# Patient Record
Sex: Male | Born: 1950 | Race: White | Hispanic: No | Marital: Married | Smoking: Former smoker
Health system: Southern US, Community
[De-identification: ages and names within clinical notes are randomized; demographics above are authoritative.]

## PROBLEM LIST (undated history)

## (undated) ENCOUNTER — Emergency Department (HOSPITAL_BASED_OUTPATIENT_CLINIC_OR_DEPARTMENT_OTHER): Admission: EM

## (undated) DIAGNOSIS — R972 Elevated prostate specific antigen [PSA]: Secondary | ICD-10-CM

## (undated) DIAGNOSIS — E291 Testicular hypofunction: Secondary | ICD-10-CM

## (undated) DIAGNOSIS — L409 Psoriasis, unspecified: Secondary | ICD-10-CM

## (undated) DIAGNOSIS — I452 Bifascicular block: Secondary | ICD-10-CM

## (undated) DIAGNOSIS — I77819 Aortic ectasia, unspecified site: Secondary | ICD-10-CM

## (undated) DIAGNOSIS — J189 Pneumonia, unspecified organism: Secondary | ICD-10-CM

## (undated) DIAGNOSIS — I10 Essential (primary) hypertension: Secondary | ICD-10-CM

## (undated) DIAGNOSIS — M1990 Unspecified osteoarthritis, unspecified site: Secondary | ICD-10-CM

## (undated) DIAGNOSIS — R739 Hyperglycemia, unspecified: Secondary | ICD-10-CM

## (undated) DIAGNOSIS — C61 Malignant neoplasm of prostate: Secondary | ICD-10-CM

## (undated) HISTORY — PX: TONSILLECTOMY: SUR1361

## (undated) HISTORY — DX: Elevated prostate specific antigen (PSA): R97.20

## (undated) HISTORY — DX: Aortic ectasia, unspecified site: I77.819

## (undated) HISTORY — DX: Hyperglycemia, unspecified: R73.9

## (undated) HISTORY — DX: Malignant neoplasm of prostate: C61

## (undated) HISTORY — DX: Testicular hypofunction: E29.1

## (undated) HISTORY — DX: Bifascicular block: I45.2

## (undated) HISTORY — DX: Psoriasis, unspecified: L40.9

---

## 2005-09-04 HISTORY — PX: HERNIA REPAIR: SHX51

## 2009-10-05 HISTORY — PX: CATARACT EXTRACTION: SUR2

## 2016-09-04 DIAGNOSIS — J40 Bronchitis, not specified as acute or chronic: Secondary | ICD-10-CM

## 2016-09-04 HISTORY — DX: Bronchitis, not specified as acute or chronic: J40

## 2017-06-04 HISTORY — PX: BASAL CELL CARCINOMA EXCISION: SHX1214

## 2018-07-04 DIAGNOSIS — C801 Malignant (primary) neoplasm, unspecified: Secondary | ICD-10-CM

## 2018-07-04 DIAGNOSIS — C4491 Basal cell carcinoma of skin, unspecified: Secondary | ICD-10-CM

## 2018-07-04 HISTORY — DX: Basal cell carcinoma of skin, unspecified: C44.91

## 2018-07-04 HISTORY — DX: Malignant (primary) neoplasm, unspecified: C80.1

## 2018-07-05 HISTORY — PX: BICEPS TENDON REPAIR: SHX566

## 2019-02-10 ENCOUNTER — Telehealth: Payer: Self-pay

## 2019-02-10 NOTE — Telephone Encounter (Signed)
    [facility] Pre-operative Risk Assessment    Request for surgical clearance:  1. What type of surgery is being performed? LEFT TOTAL KNEE REPLACEMENT   2. When is this surgery scheduled? TBD   3. What type of clearance is required (medical clearance vs. Pharmacy clearance to hold med vs. Both)? MEDICAL  4. Are there any medications that need to be held prior to surgery and how long? NONE LISTED   5. Practice name and name of physician performing surgery? [facility] ATTN:KELLY HIGH  6. What is your office phone number [phone]    7.   What is your office fax number [phone]  8.   Anesthesia type (None, local, MAC, general) ? NOT LISTED  PT HAS APPT SCHEDULED 02-14-2019 _0      Richard Harmon 02/10/2019, 1:01 PM  _________________________________________________________________   (provider comments below)

## 2019-02-10 NOTE — Telephone Encounter (Signed)
New pt to cardiology. He has a new pt appt with Dr. Gwenlyn Found scheduled on 02/14/19 for cardiovascular /preopeative risk assessment. Dr. Gwenlyn Found to determine clearance.

## 2019-02-11 DIAGNOSIS — I10 Essential (primary) hypertension: Secondary | ICD-10-CM

## 2019-02-11 DIAGNOSIS — E291 Testicular hypofunction: Secondary | ICD-10-CM

## 2019-02-11 DIAGNOSIS — E785 Hyperlipidemia, unspecified: Secondary | ICD-10-CM | POA: Diagnosis present

## 2019-02-11 DIAGNOSIS — M1712 Unilateral primary osteoarthritis, left knee: Secondary | ICD-10-CM | POA: Diagnosis present

## 2019-02-11 HISTORY — DX: Unilateral primary osteoarthritis, left knee: M17.12

## 2019-02-11 HISTORY — DX: Testicular hypofunction: E29.1

## 2019-02-11 HISTORY — DX: Hyperlipidemia, unspecified: E78.5

## 2019-02-11 HISTORY — DX: Essential (primary) hypertension: I10

## 2019-02-11 NOTE — H&P (Signed)
KNEE ARTHROPLASTY ADMISSION H&P  Patient ID: Richard Harmon MRN: [id number] DOB/AGE: 05/11/1951 68 y.o.  Chief Complaint: left knee pain.  Planned Procedure Date: 03/04/2019 Medical Clearance by Dr. London Pepper   HPI: Richard Harmon is a 68 y.o. male with a history of HTN, HLD, testosterone deficiency, and RBBB (cardiac clearance pending) who presents for evaluation of OA LEFT KNEE. The patient has a history of pain and functional disability in the left knee due to arthritis and has failed non-surgical conservative treatments for greater than 12 weeks to include NSAID's and/or analgesics, corticosteriod injections, viscosupplementation injections and activity modification.  Onset of symptoms was gradual, starting 6 years ago with gradually worsening course since that time. Patient currently rates pain at 9 out of 10 with activity. Patient has night pain, worsening of pain with activity and weight bearing and pain that interferes with activities of daily living.  Patient has evidence of periarticular osteophytes and joint space narrowing by imaging studies.  There is no active infection.  Past medical history: Hypertension, hyperlipidemia, RBBB, and testosterone deficiency.  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  Rare alcohol use.  Family history: Mother with HTN, CVA.  Father with heart disease, MI.  Maternal grandfather with cancer.  Child with seizures.   ROS: Currently denies lightheadedness, dizziness, Fever, chills, CP, SOB.   No personal history of DVT, PE, MI, or CVA. No loose teeth or  dentures. He wears hearing aids and a right contact.  All other systems have been reviewed and were otherwise currently negative with the exception of those mentioned in the HPI and as above.  Objective: Vitals: Ht: 5'6" Wt: 195 Temp: 98.8 BP: 161/89 Pulse: 76 O2 95% on room air.   Physical Exam: General: Alert, NAD.  Antalgic Gait  HEENT: EOMI, Good Neck Extension  Pulm: No increased work of breathing.  Clear B/L A/P w/o crackle or wheeze.  CV: RRR, No m/g/r appreciated  GI: soft, NT, ND Neuro: Neuro without gross focal deficit.  Sensation intact distally Skin: No lesions in the area of chief complaint MSK/Surgical Site: left knee w/o redness or effusion.  + JLT and patellar crepitus. ROM 2-115.  5/5 strength in extension and flexion.  +EHL/FHL.  NVI.  Stable varus and valgus stress.    Imaging Review Plain radiographs demonstrate severe degenerative joint disease of the left knee.   Preoperative templating of the joint replacement has been completed, documented, and submitted to the Operating Room personnel in order to optimize intra-operative equipment management.  Assessment: OA LEFT KNEE Principal Problem:   Primary osteoarthritis of left knee Active Problems:   Essential hypertension   Hyperlipidemia   Testosterone deficiency in male   Plan: Plan for Procedure(s): TOTAL KNEE ARTHROPLASTY  The patient history, physical exam, clinical judgement of the provider and imaging are consistent with end stage degenerative joint disease and total joint arthroplasty is deemed medically necessary. The treatment options including medical management, injection therapy, and arthroplasty were discussed at length. The risks and benefits of Procedure(s): TOTAL KNEE ARTHROPLASTY were presented and reviewed.  The risks of nonoperative treatment, versus surgical intervention including but not limited to  continued pain, aseptic loosening, stiffness, dislocation/subluxation, infection, bleeding,  nerve injury, blood clots, cardiopulmonary complications, morbidity, mortality, among others were discussed. The patient verbalizes understanding and wishes to proceed with the plan.  Patient is being admitted for inpatient treatment for surgery, pain control, PT, OT, prophylactic antibiotics, VTE prophylaxis, progressive ambulation, ADL's and discharge planning.   Dental prophylaxis discussed and recommended for 2 years postoperatively.   The patient does meet the criteria for TXA which will be used perioperatively.    ASA 81 mg BID will be used postoperatively for DVT prophylaxis in addition to SCDs, and early ambulation.  Plan for Tylenol, Celebrex, oxycodone for pain.  NO Gabapentin - wife had bad rxn to it.  Baclofen for spasm.  Omeprazole for gastric protection.  The patient is planning to be discharged home with home health services ([facility]) in care of his wife.   Patient's anticipated LOS is less than 2 midnights, meeting these requirements: - Lives within 1 hour of care - Has a competent adult at home to recover with post-op recover - NO history of  - Chronic pain requiring opiods  - Diabetes  - Coronary Artery Disease  - Heart failure  - Heart attack  - Stroke  - DVT/VTE  - Cardiac arrhythmia  - Respiratory Failure/COPD  - Renal failure  - Anemia  - Advanced Liver disease   [name], PA-C 02/11/2019 3:47 PM

## 2019-02-14 ENCOUNTER — Encounter: Payer: Self-pay | Admitting: Cardiovascular Disease

## 2019-02-14 ENCOUNTER — Other Ambulatory Visit: Payer: Self-pay

## 2019-02-14 ENCOUNTER — Ambulatory Visit (INDEPENDENT_AMBULATORY_CARE_PROVIDER_SITE_OTHER): Payer: Medicare Other | Admitting: Cardiovascular Disease

## 2019-02-14 DIAGNOSIS — Z01818 Encounter for other preprocedural examination: Secondary | ICD-10-CM | POA: Diagnosis not present

## 2019-02-14 NOTE — Assessment & Plan Note (Signed)
Richard Harmon was referred by Dr. Orland Mustard for preoperative clearance before left total knee replacement scheduled to be performed by Dr. Edmonia Lynch 6/30.  He has no cardiac risk factors.  He is completely asymptomatic.  There is no family history.  He otherwise was fairly active.  I am clearing him at low risk without the need for functional study.Marland Kitchen

## 2019-02-14 NOTE — Progress Notes (Signed)
02/14/2019 Nyshawn Gowdy   1950/09/05  [id number]  Primary Physician London Pepper, MD Primary Cardiologist: Lorretta Harp MD Lupe Carney, [place]  HPI:  Dmarcus Decicco is a 68 y.o. 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  He was referred by Dr. Orland Mustard for preoperative clearance before elective left total knee replacement scheduled to be performed by Dr. Fredonia Highland 6/30.  He has no cardiac risk factors.  Is never had a heart attack or stroke.  There is no family history.  He denies chest pain or shortness of breath.  He is pretty active walks when he is able to because of his knee.   No outpatient medications have been marked as taking for the 02/14/19 encounter (Office Visit) with Lorretta Harp, MD.     Not on File  Social History   Socioeconomic History  . Marital status: Married    Spouse name: Not on file  . Number of children: Not on file  . Years of education: Not on file  . Highest education level: Not on file  Occupational History  . Not on file  Social Needs  . Financial resource strain: Not on file  . Food insecurity    Worry: Not on file    Inability: Not on file  . Transportation needs    Medical: Not on file    Non-medical: Not on file  Tobacco Use  . Smoking status: Not on file  Substance and Sexual Activity  . Alcohol use: Not on file  . Drug use: Not on file  . Sexual activity: Not on file  Lifestyle  . Physical activity    Days per week: Not on file    Minutes per session: Not on file  . Stress: Not on file  Relationships  . Social Herbalist on phone: Not on file    Gets together: Not on file    Attends religious service: Not on file    Active member of club or organization: Not on file    Attends meetings of clubs or organizations: Not on file    Relationship status: Not on file  . Intimate partner  violence    Fear of current or ex partner: Not on file    Emotionally abused: Not on file    Physically abused: Not on file    Forced sexual activity: Not on file  Other Topics Concern  . Not on file  Social History Narrative  . Not on file     Review of Systems: General: negative for chills, fever, night sweats or weight changes.  Cardiovascular: negative for chest pain, dyspnea on exertion, edema, orthopnea, palpitations, paroxysmal nocturnal dyspnea or shortness of breath Dermatological: negative for rash Respiratory: negative for cough or wheezing Urologic: negative for hematuria Abdominal: negative for nausea, vomiting, diarrhea, bright red blood per rectum, melena, or hematemesis Neurologic: negative for visual changes, syncope, or dizziness All other systems reviewed and are otherwise negative except as noted above.    Blood pressure 138/76, pulse 84, temperature 99.1 F (37.3 C), height 5\' 9"  (1.753 m), weight 190 lb (86.2 kg).  General appearance: alert and no distress Neck: no adenopathy, no carotid bruit, no JVD, supple, symmetrical, trachea midline and thyroid not enlarged, symmetric, no tenderness/mass/nodules Lungs: clear to auscultation bilaterally Heart: regular rate and rhythm, S1, S2 normal, no murmur,  click, rub or gallop Extremities: extremities normal, atraumatic, no cyanosis or edema Pulses: 2+ and symmetric Skin: Skin color, texture, turgor normal. No rashes or lesions Neurologic: Alert and oriented X 3, normal strength and tone. Normal symmetric reflexes. 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  I personally reviewed this EKG.  ASSESSMENT AND PLAN:   Preoperative clearance Mr. Bruso was referred by Dr. Orland Mustard for preoperative clearance before left total knee replacement scheduled to be performed by Dr. Edmonia Lynch 6/30.  He has no cardiac risk  factors.  He is completely asymptomatic.  There is no family history.  He otherwise was fairly active.  I am clearing him at low risk without the need for functional study.Lorretta Harp MD FACP,FACC,FAHA, [facility] 02/14/2019 4:33 PM

## 2019-02-14 NOTE — Patient Instructions (Signed)
Medication Instructions:  Your physician recommends that you continue on your current medications as directed. Please refer to the Current Medication list given to you today.  If you need a refill on your cardiac medications before your next appointment, please call your pharmacy.   Lab work: NONE If you have labs (blood work) drawn today and your tests are completely normal, you will receive your results only by: Marland Kitchen MyChart Message (if you have MyChart) OR . A paper copy in the mail If you have any lab test that is abnormal or we need to change your treatment, we will call you to review the results.  Testing/Procedures: NONE  Follow-Up: At [facility], you and your health needs are our priority.  As part of our continuing mission to provide you with exceptional heart care, we have created designated Provider Care Teams.  These Care Teams include your primary Cardiologist (physician) and Advanced Practice Providers (APPs -  Physician Assistants and Nurse Practitioners) who all work together to provide you with the care you need, when you need it. . You will need a follow up appointment AS NEEDED. You may see Dr. Gwenlyn Found or one of the following Advanced Practice Providers on your designated Care Team:   . Kerin Ransom, [place] . Almyra Deforest, PA-C . Fabian Sharp, PA-C . Jory Sims, DNP . Rosaria Ferries, PA-C . Roby Lofts, PA-C . Sande Rives, PA-C  Any Other Special Instructions Will Be Listed Below (If Applicable). YOU HAVE BEEN CLEARED AT LOW RISK FROM A CARDIAC STANDPOINT FOR YOUR UPCOMING PROCEDURE.

## 2019-02-21 NOTE — Patient Instructions (Addendum)
Richard Harmon    Your procedure is scheduled on: 03-04-2019  Report to [facility] Main  Entrance  Report to SHORT STAY at 530 AM   YOU NEED TO HAVE A COVID 19 TEST ON 02-28-19  @ 2:00 PM_______, THIS TEST MUST BE DONE BEFORE SURGERY, COME TO [place]. ONCE YOUR COVID TEST IS COMPLETED, PLEASE BEGIN THE QUARANTINE INSTRUCTIONS AS OUTLINED IN YOUR HANDOUT.   Call this number if you have problems the morning of surgery [phone]    Remember:  NO SOLID FOOD AFTER MIDNIGHT THE NIGHT PRIOR TO SURGERY. NOTHING BY MOUTH EXCEPT CLEAR LIQUIDS UNTIL 430 AM. PLEASE FINISH ENSURE DRINK PER SURGEON ORDER 3 HOURS PRIOR TO SCHEDULED SURGERY TIME WHICH NEEDS TO BE COMPLETED AT 430 AM..    CLEAR LIQUID DIET   Foods Allowed                                                                     Foods Excluded  Coffee and tea, regular and decaf                             liquids that you cannot  Plain Jell-O in any flavor                                             see through such as: Fruit ices (not with fruit pulp)                                     milk, soups, orange juice  Iced Popsicles                                    All solid food Carbonated beverages, regular and diet                                    Cranberry, grape and apple juices Sports drinks like Gatorade Lightly seasoned clear broth or consume(fat free) Sugar, honey syrup  Sample Menu Breakfast                                Lunch                                     Supper Cranberry juice                    Beef broth                            Chicken broth Jell-O  Grape juice                           Apple juice Coffee or tea                        Jell-O                                      Popsicle                                                Coffee or tea                        Coffee or  tea  _____________________________________________________________________    Take these medicines the morning of surgery with A SIP OF WATER:NONE  BRUSH YOUR TEETH MORNING OF SURGERY AND RINSE YOUR MOUTH OUT, NO CHEWING GUM CANDY OR MINTS.                                You may not have any metal on your body including hair pins and              piercings     Do not wear jewelry, cologne, lotions, powders or perfumes, deodorant                         Men may shave face and neck.   Do not bring valuables to the hospital. [place].  Contacts, dentures or bridgework may not be worn into surgery.       _____________________________________________________________________             [facility] - Preparing for Surgery Before surgery, you can play an important role.  Because skin is not sterile, your skin needs to be as free of germs as possible.  You can reduce the number of germs on your skin by washing with CHG (chlorahexidine gluconate) soap before surgery.  CHG is an antiseptic cleaner which kills germs and bonds with the skin to continue killing germs even after washing. Please DO NOT use if you have an allergy to CHG or antibacterial soaps.  If your skin becomes reddened/irritated stop using the CHG and inform your nurse when you arrive at Short Stay. Do not shave (including legs and underarms) for at least 48 hours prior to the first CHG shower.  You may shave your face/neck. Please follow these instructions carefully:  1.  Shower with CHG Soap the night before surgery and the  morning of Surgery.  2.  If you choose to wash your hair, wash your hair first as usual with your  normal  shampoo.  3.  After you shampoo, rinse your hair and body thoroughly to remove the  shampoo.                           4.  Use CHG as you would any other liquid soap.  You can  apply chg directly  to the skin and wash                       Gently  with a scrungie or clean washcloth.  5.  Apply the CHG Soap to your body ONLY FROM THE NECK DOWN.   Do not use on face/ open                           Wound or open sores. Avoid contact with eyes, ears mouth and genitals (private parts).                       Wash face,  Genitals (private parts) with your normal soap.             6.  Wash thoroughly, paying special attention to the area where your surgery  will be performed.  7.  Thoroughly rinse your body with warm water from the neck down.  8.  DO NOT shower/wash with your normal soap after using and rinsing off  the CHG Soap.                9.  Pat yourself dry with a clean towel.            10.  Wear clean pajamas.            11.  Place clean sheets on your bed the night of your first shower and do not  sleep with pets. Day of Surgery : Do not apply any lotions/deodorants the morning of surgery.  Please wear clean clothes to the hospital/surgery center.  FAILURE TO FOLLOW THESE INSTRUCTIONS MAY RESULT IN THE CANCELLATION OF YOUR SURGERY PATIENT SIGNATURE_________________________________  NURSE SIGNATURE__________________________________  ________________________________________________________________________   Richard Harmon  An incentive spirometer is a tool that can help keep your lungs clear and active. This tool measures how well you are filling your lungs with each breath. Taking long deep breaths may help reverse or decrease the chance of developing breathing (pulmonary) problems (especially infection) following:  A long period of time when you are unable to move or be active. BEFORE THE PROCEDURE   If the spirometer includes an indicator to show your best effort, your nurse or respiratory therapist will set it to a desired goal.  If possible, sit up straight or lean slightly forward. Try not to slouch.  Hold the incentive spirometer in an upright position. INSTRUCTIONS FOR USE  1. Sit on the edge of your bed if  possible, or sit up as far as you can in bed or on a chair. 2. Hold the incentive spirometer in an upright position. 3. Breathe out normally. 4. Place the mouthpiece in your mouth and seal your lips tightly around it. 5. Breathe in slowly and as deeply as possible, raising the piston or the ball toward the top of the column. 6. Hold your breath for 3-5 seconds or for as long as possible. Allow the piston or ball to fall to the bottom of the column. 7. Remove the mouthpiece from your mouth and breathe out normally. 8. Rest for a few seconds and repeat Steps 1 through 7 at least 10 times every 1-2 hours when you are awake. Take your time and take a few normal breaths between deep breaths. 9. The spirometer may include an indicator to show your best effort. Use the indicator as a goal to work toward during  each repetition. 10. After each set of 10 deep breaths, practice coughing to be sure your lungs are clear. If you have an incision (the cut made at the time of surgery), support your incision when coughing by placing a pillow or rolled up towels firmly against it. Once you are able to get out of bed, walk around indoors and cough well. You may stop using the incentive spirometer when instructed by your caregiver.  RISKS AND COMPLICATIONS  Take your time so you do not get dizzy or light-headed.  If you are in pain, you may need to take or ask for pain medication before doing incentive spirometry. It is harder to take a deep breath if you are having pain. AFTER USE  Rest and breathe slowly and easily.  It can be helpful to keep track of a log of your progress. Your caregiver can provide you with a simple table to help with this. If you are using the spirometer at home, follow these instructions: [place] IF:   You are having difficultly using the spirometer.  You have trouble using the spirometer as often as instructed.  Your pain medication is not giving enough relief while using  the spirometer.  You develop fever of 100.5 F (38.1 C) or higher. SEEK IMMEDIATE MEDICAL CARE IF:   You cough up bloody sputum that had not been present before.  You develop fever of 102 F (38.9 C) or greater.  You develop worsening pain at or near the incision site. MAKE SURE YOU:   Understand these instructions.  Will watch your condition.  Will get help right away if you are not doing well or get worse. Document Released: 01/01/2007 Document Revised: 11/13/2011 Document Reviewed: 03/04/2007 [facility] Patient Information 2014 [employer], [place].   ________________________________________________________________________

## 2019-02-24 ENCOUNTER — Other Ambulatory Visit: Payer: Self-pay

## 2019-02-24 ENCOUNTER — Encounter (HOSPITAL_COMMUNITY)
Admission: RE | Admit: 2019-02-24 | Discharge: 2019-02-24 | Disposition: A | Discharge status: Home / Self Care | Payer: Medicare Other | Source: Ambulatory Visit | Attending: Orthopedic Surgery | Admitting: Orthopedic Surgery

## 2019-02-24 ENCOUNTER — Encounter (HOSPITAL_COMMUNITY): Payer: Self-pay

## 2019-02-24 DIAGNOSIS — Z79899 Other long term (current) drug therapy: Secondary | ICD-10-CM | POA: Diagnosis not present

## 2019-02-24 DIAGNOSIS — Z87891 Personal history of nicotine dependence: Secondary | ICD-10-CM | POA: Diagnosis not present

## 2019-02-24 DIAGNOSIS — Z01818 Encounter for other preprocedural examination: Secondary | ICD-10-CM | POA: Insufficient documentation

## 2019-02-24 DIAGNOSIS — M1712 Unilateral primary osteoarthritis, left knee: Secondary | ICD-10-CM | POA: Diagnosis not present

## 2019-02-24 DIAGNOSIS — I10 Essential (primary) hypertension: Secondary | ICD-10-CM | POA: Diagnosis not present

## 2019-02-24 DIAGNOSIS — Z7982 Long term (current) use of aspirin: Secondary | ICD-10-CM | POA: Diagnosis not present

## 2019-02-24 HISTORY — DX: Essential (primary) hypertension: I10

## 2019-02-24 HISTORY — DX: Unspecified osteoarthritis, unspecified site: M19.90

## 2019-02-24 HISTORY — DX: Pneumonia, unspecified organism: J18.9

## 2019-02-24 LAB — CBC
HCT: 53 % — ABNORMAL HIGH (ref 39.0–52.0)
Hemoglobin: 16.3 g/dL (ref 13.0–17.0)
MCH: 24.7 pg — ABNORMAL LOW (ref 26.0–34.0)
MCHC: 30.8 g/dL (ref 30.0–36.0)
MCV: 80.2 fL (ref 80.0–100.0)
Platelets: 219 10*3/uL (ref 150–400)
RBC: 6.61 MIL/uL — ABNORMAL HIGH (ref 4.22–5.81)
RDW: 18.1 % — ABNORMAL HIGH (ref 11.5–15.5)
WBC: 7.3 10*3/uL (ref 4.0–10.5)
nRBC: 0 % (ref 0.0–0.2)

## 2019-02-24 LAB — BASIC METABOLIC PANEL
Anion gap: 10 (ref 5–15)
BUN: 22 mg/dL (ref 8–23)
CO2: 28 mmol/L (ref 22–32)
Calcium: 9.4 mg/dL (ref 8.9–10.3)
Chloride: 102 mmol/L (ref 98–111)
Creatinine, Ser: 0.87 mg/dL (ref 0.61–1.24)
GFR calc Af Amer: >60 mL/min (ref >60)
GFR calc non Af Amer: >60 mL/min (ref >60)
Glucose, Bld: 107 mg/dL — ABNORMAL HIGH (ref 70–99)
Potassium: 4.2 mmol/L (ref 3.5–5.1)
Sodium: 140 mmol/L (ref 135–145)

## 2019-02-24 LAB — SURGICAL PCR SCREEN
MRSA, PCR: NEGATIVE
Staphylococcus aureus: NEGATIVE

## 2019-02-24 NOTE — Progress Notes (Signed)
02-14-19 ( Epic) Cardiac clearance from D. Berry and EKG

## 2019-02-26 NOTE — Progress Notes (Signed)
Anesthesia Chart Review   Case: [id number] Date/Time: 03/04/19 0715   Procedure: TOTAL KNEE ARTHROPLASTY (Left )   Anesthesia type: Choice   Pre-op diagnosis: OA LEFT KNEE   Location: [facility] ROOM 08 / [facility]   Surgeon: Renette Butters, MD      DISCUSSION:68 y.o. former smoker (1 pack years) with h/o HTN, left knee OA scheduled for above procedure 03/04/19 with Dr. Edmonia Lynch.   Pt seen by cardiologist, Dr. Quay Burow, for preoperative evaluation on 02/14/2019.  Per OV note, " He has no cardiac risk factors.  He is completely asymptomatic.  There is no family history.  He otherwise was fairly active.  I am clearing him at low risk without the need for functional study."  Anticipate pt can proceed with planned procedure barring acute status change.   VS: BP (!) 162/86   Pulse 75   Temp 37.2 C (Oral)   Resp 18   Ht 5\' 9"  (1.753 m)   Wt 87.4 kg   SpO2 97%   BMI 28.44 kg/m   PROVIDERS: London Pepper, MD is PCP    LABS: Labs reviewed: Acceptable for surgery. (all labs ordered are listed, but only abnormal results are displayed)  Labs Reviewed  BASIC METABOLIC PANEL - Abnormal; Notable for the following components:      Result Value   Glucose, Bld 107 (*)    All other components within normal limits  CBC - Abnormal; Notable for the following components:   RBC 6.61 (*)    HCT 53.0 (*)    MCH 24.7 (*)    RDW 18.1 (*)    All other components within normal limits  SURGICAL PCR SCREEN     IMAGES:   EKG: 02/14/2019 Rate 84 bpm Sinus rhythm with premature atrial complexes with aberrant conduction  Right bundle branch block  Left anterior fascicular block  Bifascicular block   CV:  Past Medical History:  Diagnosis Date  . Arthritis   . Bronchitis 2018  . Cancer ([place]) 07/04/2018   Basal Cell   . Hypertension   . Pneumonia     Past Surgical History:  Procedure Laterality Date  . BASAL CELL CARCINOMA EXCISION  06/2017  . BICEPS TENDON REPAIR  07/05/2018  .  CATARACT EXTRACTION Left 10/05/2009  . HERNIA REPAIR  2007  . TONSILLECTOMY      MEDICATIONS: . acetaminophen (TYLENOL) 325 MG tablet  . amLODipine (NORVASC) 5 MG tablet  . aspirin EC 81 MG tablet  . atorvastatin (LIPITOR) 40 MG tablet  . Carboxymethylcellul-Glycerin (LUBRICATING EYE DROPS OP)  . Cholecalciferol (DIALYVITE VITAMIN D 5000) 125 MCG (5000 UT) capsule  . ibuprofen (ADVIL) 200 MG tablet  . Krill Oil 500 MG CAPS  . Melatonin 10 MG CAPS  . Testosterone (ANDROGEL) 25 MG/2.5GM (1%) GEL  . valsartan-hydrochlorothiazide (DIOVAN-HCT) 320-25 MG tablet   No current facility-administered medications for this encounter.     Maia Plan [facility] Pre-Surgical Testing [phone] 02/26/19  11:20 AM

## 2019-02-26 NOTE — Anesthesia Preprocedure Evaluation (Addendum)
Anesthesia Evaluation  Patient identified by MRN, date of birth, ID band Patient awake    Reviewed: Allergy & Precautions, NPO status , Patient's Chart, lab work & pertinent test results  Airway Mallampati: II  TM Distance: >3 FB Neck ROM: Full    Dental  (+) Dental Advisory Given   Pulmonary pneumonia, former smoker,    Pulmonary exam normal breath sounds clear to auscultation       Cardiovascular hypertension, Pt. on medications Normal cardiovascular exam Rhythm:Regular Rate:Normal     Neuro/Psych negative neurological ROS  negative psych ROS   GI/Hepatic negative GI ROS, Neg liver ROS,   Endo/Other  negative endocrine ROS  Renal/GU negative Renal ROS     Musculoskeletal  (+) Arthritis ,   Abdominal   Peds  Hematology negative hematology ROS (+)   Anesthesia Other Findings   Reproductive/Obstetrics negative OB ROS                            Anesthesia Physical Anesthesia Plan  ASA: II  Anesthesia Plan: Spinal   Post-op Pain Management:  Regional for Post-op pain   Induction: Intravenous  PONV Risk Score and Plan: 2 and Ondansetron, Propofol infusion, Dexamethasone and Treatment may vary due to age or medical condition  Airway Management Planned: Natural Airway  Additional Equipment:   Intra-op Plan:   Post-operative Plan:   Informed Consent: I have reviewed the patients History and Physical, chart, labs and discussed the procedure including the risks, benefits and alternatives for the proposed anesthesia with the patient or authorized representative who has indicated his/her understanding and acceptance.     Dental advisory given  Plan Discussed with: CRNA  Anesthesia Plan Comments: (See PAT note 02/24/2019, Konrad Felix, PA-C)       Anesthesia Quick Evaluation

## 2019-02-28 ENCOUNTER — Other Ambulatory Visit (HOSPITAL_COMMUNITY)
Admission: RE | Admit: 2019-02-28 | Discharge: 2019-02-28 | Disposition: A | Discharge status: Home / Self Care | Payer: Medicare Other | Source: Ambulatory Visit | Attending: Orthopedic Surgery | Admitting: Orthopedic Surgery

## 2019-02-28 DIAGNOSIS — I10 Essential (primary) hypertension: Secondary | ICD-10-CM | POA: Diagnosis present

## 2019-02-28 DIAGNOSIS — Z1159 Encounter for screening for other viral diseases: Secondary | ICD-10-CM | POA: Diagnosis not present

## 2019-02-28 DIAGNOSIS — Z87891 Personal history of nicotine dependence: Secondary | ICD-10-CM | POA: Diagnosis not present

## 2019-02-28 DIAGNOSIS — M1712 Unilateral primary osteoarthritis, left knee: Secondary | ICD-10-CM | POA: Diagnosis present

## 2019-02-28 DIAGNOSIS — Z823 Family history of stroke: Secondary | ICD-10-CM | POA: Diagnosis not present

## 2019-02-28 DIAGNOSIS — M25762 Osteophyte, left knee: Secondary | ICD-10-CM | POA: Diagnosis present

## 2019-02-28 DIAGNOSIS — Z8249 Family history of ischemic heart disease and other diseases of the circulatory system: Secondary | ICD-10-CM | POA: Diagnosis not present

## 2019-02-28 DIAGNOSIS — Z79899 Other long term (current) drug therapy: Secondary | ICD-10-CM | POA: Diagnosis not present

## 2019-02-28 DIAGNOSIS — E785 Hyperlipidemia, unspecified: Secondary | ICD-10-CM | POA: Diagnosis present

## 2019-02-28 DIAGNOSIS — Z79890 Hormone replacement therapy (postmenopausal): Secondary | ICD-10-CM | POA: Diagnosis not present

## 2019-02-28 DIAGNOSIS — M25562 Pain in left knee: Secondary | ICD-10-CM | POA: Diagnosis present

## 2019-02-28 DIAGNOSIS — E291 Testicular hypofunction: Secondary | ICD-10-CM | POA: Diagnosis present

## 2019-02-28 DIAGNOSIS — Z7982 Long term (current) use of aspirin: Secondary | ICD-10-CM | POA: Diagnosis not present

## 2019-02-28 LAB — SARS CORONAVIRUS 2 (TAT 6-24 HRS): SARS Coronavirus 2: NEGATIVE

## 2019-03-03 MED ORDER — BUPIVACAINE LIPOSOME 1.3 % IJ SUSP
20.0000 mL | Freq: Once | INTRAMUSCULAR | Status: DC
  Filled 2019-03-03: qty 20

## 2019-03-04 ENCOUNTER — Encounter (HOSPITAL_COMMUNITY): Payer: Self-pay

## 2019-03-04 ENCOUNTER — Observation Stay (HOSPITAL_COMMUNITY): Payer: Medicare Other

## 2019-03-04 ENCOUNTER — Encounter (HOSPITAL_COMMUNITY)
Admission: RE | Disposition: A | Discharge status: Home Health | Payer: Self-pay | Source: Ambulatory Visit | Attending: Orthopedic Surgery

## 2019-03-04 ENCOUNTER — Inpatient Hospital Stay (HOSPITAL_COMMUNITY): Payer: Medicare Other | Admitting: Physician Assistant

## 2019-03-04 ENCOUNTER — Other Ambulatory Visit: Payer: Self-pay

## 2019-03-04 ENCOUNTER — Inpatient Hospital Stay (HOSPITAL_COMMUNITY)
Admission: RE | Admit: 2019-03-04 | Discharge: 2019-03-05 | DRG: 470 | Disposition: A | Discharge status: Home Health | Payer: Medicare Other | Source: Ambulatory Visit | Attending: Orthopedic Surgery | Admitting: Orthopedic Surgery

## 2019-03-04 ENCOUNTER — Inpatient Hospital Stay (HOSPITAL_COMMUNITY): Payer: Medicare Other | Admitting: Certified Registered Nurse Anesthetist

## 2019-03-04 DIAGNOSIS — I10 Essential (primary) hypertension: Secondary | ICD-10-CM | POA: Diagnosis present

## 2019-03-04 DIAGNOSIS — Z8249 Family history of ischemic heart disease and other diseases of the circulatory system: Secondary | ICD-10-CM

## 2019-03-04 DIAGNOSIS — M25562 Pain in left knee: Secondary | ICD-10-CM | POA: Diagnosis present

## 2019-03-04 DIAGNOSIS — Z79890 Hormone replacement therapy (postmenopausal): Secondary | ICD-10-CM | POA: Diagnosis not present

## 2019-03-04 DIAGNOSIS — M1712 Unilateral primary osteoarthritis, left knee: Secondary | ICD-10-CM | POA: Diagnosis present

## 2019-03-04 DIAGNOSIS — Z96659 Presence of unspecified artificial knee joint: Secondary | ICD-10-CM

## 2019-03-04 DIAGNOSIS — E291 Testicular hypofunction: Secondary | ICD-10-CM | POA: Diagnosis present

## 2019-03-04 DIAGNOSIS — Z7982 Long term (current) use of aspirin: Secondary | ICD-10-CM

## 2019-03-04 DIAGNOSIS — E785 Hyperlipidemia, unspecified: Secondary | ICD-10-CM | POA: Diagnosis present

## 2019-03-04 DIAGNOSIS — Z1159 Encounter for screening for other viral diseases: Secondary | ICD-10-CM

## 2019-03-04 DIAGNOSIS — Z87891 Personal history of nicotine dependence: Secondary | ICD-10-CM

## 2019-03-04 DIAGNOSIS — Z79899 Other long term (current) drug therapy: Secondary | ICD-10-CM

## 2019-03-04 DIAGNOSIS — M25762 Osteophyte, left knee: Secondary | ICD-10-CM | POA: Diagnosis present

## 2019-03-04 DIAGNOSIS — Z823 Family history of stroke: Secondary | ICD-10-CM | POA: Diagnosis not present

## 2019-03-04 HISTORY — DX: Presence of unspecified artificial knee joint: Z96.659

## 2019-03-04 HISTORY — PX: TOTAL KNEE ARTHROPLASTY: SHX125

## 2019-03-04 SURGERY — ARTHROPLASTY, KNEE, TOTAL
Anesthesia: Spinal | Site: Knee | Laterality: Left

## 2019-03-04 MED ORDER — CLONIDINE HCL (ANALGESIA) 100 MCG/ML EP SOLN
EPIDURAL | Status: DC | PRN
  Administered 2019-03-04: 80 ug

## 2019-03-04 MED ORDER — PROPOFOL 500 MG/50ML IV EMUL
INTRAVENOUS | Status: DC | PRN
  Administered 2019-03-04: 100 ug/kg/min via INTRAVENOUS

## 2019-03-04 MED ORDER — CHLORHEXIDINE GLUCONATE 4 % EX LIQD: 60.0000 mL | Freq: Once | CUTANEOUS | Status: DC

## 2019-03-04 MED ORDER — CELECOXIB 200 MG PO CAPS
200.0000 mg | ORAL_CAPSULE | Freq: Two times a day (BID) | ORAL | Status: DC
  Administered 2019-03-04 – 2019-03-05 (×3): 200 mg via ORAL
  Filled 2019-03-04 (×3): qty 1

## 2019-03-04 MED ORDER — CEFAZOLIN SODIUM-DEXTROSE 2-4 GM/100ML-% IV SOLN
2.0000 g | INTRAVENOUS | Status: AC
  Administered 2019-03-04: 08:00:00 2 g via INTRAVENOUS
  Filled 2019-03-04: qty 100

## 2019-03-04 MED ORDER — TRANEXAMIC ACID-NACL 1000-0.7 MG/100ML-% IV SOLN
1000.0000 mg | INTRAVENOUS | Status: AC
  Administered 2019-03-04: 08:00:00 1000 mg via INTRAVENOUS
  Filled 2019-03-04: qty 100

## 2019-03-04 MED ORDER — POVIDONE-IODINE 10 % EX SWAB: 2.0000 "application " | Freq: Once | CUTANEOUS | Status: DC

## 2019-03-04 MED ORDER — PROPOFOL 10 MG/ML IV BOLUS
INTRAVENOUS | Status: AC
  Filled 2019-03-04: qty 20

## 2019-03-04 MED ORDER — ASPIRIN EC 81 MG PO TBEC: 81.0000 mg | DELAYED_RELEASE_TABLET | Freq: Two times a day (BID) | ORAL | 0 refills | Status: DC

## 2019-03-04 MED ORDER — METHOCARBAMOL 500 MG PO TABS
500.0000 mg | ORAL_TABLET | Freq: Four times a day (QID) | ORAL | Status: DC | PRN
  Administered 2019-03-04: 500 mg via ORAL
  Filled 2019-03-04: qty 1

## 2019-03-04 MED ORDER — AMLODIPINE BESYLATE 5 MG PO TABS
5.0000 mg | ORAL_TABLET | Freq: Every evening | ORAL | Status: DC
  Administered 2019-03-04: 16:00:00 5 mg via ORAL
  Filled 2019-03-04: qty 1

## 2019-03-04 MED ORDER — OXYCODONE HCL 5 MG PO TABS: 5.0000 mg | ORAL_TABLET | ORAL | 0 refills | Status: AC | PRN

## 2019-03-04 MED ORDER — DOCUSATE SODIUM 100 MG PO CAPS: 100.0000 mg | ORAL_CAPSULE | Freq: Two times a day (BID) | ORAL | 0 refills | Status: DC

## 2019-03-04 MED ORDER — CARBOXYMETHYLCELLUL-GLYCERIN 0.5-0.9 % OP SOLN: 1.0000 [drp] | Freq: Every day | OPHTHALMIC | Status: DC | PRN

## 2019-03-04 MED ORDER — PROPOFOL 10 MG/ML IV BOLUS
INTRAVENOUS | Status: DC | PRN
  Administered 2019-03-04 (×2): 20 mg via INTRAVENOUS

## 2019-03-04 MED ORDER — ONDANSETRON HCL 4 MG PO TABS: 4.0000 mg | ORAL_TABLET | Freq: Three times a day (TID) | ORAL | 0 refills | Status: DC | PRN

## 2019-03-04 MED ORDER — VALSARTAN-HYDROCHLOROTHIAZIDE 320-25 MG PO TABS: 1.0000 | ORAL_TABLET | Freq: Every evening | ORAL | Status: DC

## 2019-03-04 MED ORDER — METOCLOPRAMIDE HCL 5 MG PO TABS: 5.0000 mg | ORAL_TABLET | Freq: Three times a day (TID) | ORAL | Status: DC | PRN

## 2019-03-04 MED ORDER — MIDAZOLAM HCL 5 MG/5ML IJ SOLN
INTRAMUSCULAR | Status: DC | PRN
  Administered 2019-03-04: 2 mg via INTRAVENOUS

## 2019-03-04 MED ORDER — BUPIVACAINE IN DEXTROSE 0.75-8.25 % IT SOLN
INTRATHECAL | Status: DC | PRN
  Administered 2019-03-04: 1.8 mL via INTRATHECAL

## 2019-03-04 MED ORDER — FENTANYL CITRATE (PF) 100 MCG/2ML IJ SOLN
INTRAMUSCULAR | Status: DC | PRN
  Administered 2019-03-04: 100 ug via INTRAVENOUS

## 2019-03-04 MED ORDER — FENTANYL CITRATE (PF) 100 MCG/2ML IJ SOLN
INTRAMUSCULAR | Status: AC
  Filled 2019-03-04: qty 2

## 2019-03-04 MED ORDER — SORBITOL 70 % SOLN
30.0000 mL | Freq: Every day | Status: DC | PRN
  Filled 2019-03-04: qty 30

## 2019-03-04 MED ORDER — ACETAMINOPHEN 500 MG PO TABS: 1000.0000 mg | ORAL_TABLET | Freq: Three times a day (TID) | ORAL | 0 refills | Status: AC

## 2019-03-04 MED ORDER — ONDANSETRON HCL 4 MG/2ML IJ SOLN: 4.0000 mg | Freq: Four times a day (QID) | INTRAMUSCULAR | Status: DC | PRN

## 2019-03-04 MED ORDER — DEXAMETHASONE SODIUM PHOSPHATE 10 MG/ML IJ SOLN
INTRAMUSCULAR | Status: DC | PRN
  Administered 2019-03-04: 10 mg via INTRAVENOUS

## 2019-03-04 MED ORDER — DEXAMETHASONE SODIUM PHOSPHATE 10 MG/ML IJ SOLN
INTRAMUSCULAR | Status: AC
  Filled 2019-03-04: qty 1

## 2019-03-04 MED ORDER — MIDAZOLAM HCL 2 MG/2ML IJ SOLN
INTRAMUSCULAR | Status: AC
  Filled 2019-03-04: qty 2

## 2019-03-04 MED ORDER — SODIUM CHLORIDE (PF) 0.9 % IJ SOLN
INTRAMUSCULAR | Status: AC
  Filled 2019-03-04: qty 30

## 2019-03-04 MED ORDER — ACETAMINOPHEN 325 MG PO TABS: 325.0000 mg | ORAL_TABLET | Freq: Four times a day (QID) | ORAL | Status: DC | PRN

## 2019-03-04 MED ORDER — OMEPRAZOLE 20 MG PO CPDR: 20.0000 mg | DELAYED_RELEASE_CAPSULE | Freq: Every day | ORAL | 0 refills | Status: DC

## 2019-03-04 MED ORDER — ASPIRIN 81 MG PO CHEW
81.0000 mg | CHEWABLE_TABLET | Freq: Two times a day (BID) | ORAL | Status: DC
  Administered 2019-03-04 – 2019-03-05 (×2): 81 mg via ORAL
  Filled 2019-03-04 (×2): qty 1

## 2019-03-04 MED ORDER — MAGNESIUM CITRATE PO SOLN: 1.0000 | Freq: Once | ORAL | Status: DC | PRN

## 2019-03-04 MED ORDER — DEXAMETHASONE SODIUM PHOSPHATE 10 MG/ML IJ SOLN
10.0000 mg | Freq: Once | INTRAMUSCULAR | Status: AC
  Administered 2019-03-05: 10 mg via INTRAVENOUS
  Filled 2019-03-04: qty 1

## 2019-03-04 MED ORDER — LACTATED RINGERS IV SOLN
INTRAVENOUS | Status: DC
  Administered 2019-03-04 (×2): via INTRAVENOUS

## 2019-03-04 MED ORDER — MELATONIN 5 MG PO TABS
10.0000 mg | ORAL_TABLET | Freq: Every day | ORAL | Status: DC
  Administered 2019-03-04: 10 mg via ORAL
  Filled 2019-03-04: qty 2

## 2019-03-04 MED ORDER — IRBESARTAN 150 MG PO TABS
300.0000 mg | ORAL_TABLET | Freq: Every evening | ORAL | Status: DC
  Administered 2019-03-04: 300 mg via ORAL
  Filled 2019-03-04: qty 2

## 2019-03-04 MED ORDER — CELECOXIB 200 MG PO CAPS: 200.0000 mg | ORAL_CAPSULE | Freq: Two times a day (BID) | ORAL | 0 refills | Status: AC

## 2019-03-04 MED ORDER — HYDROMORPHONE HCL 1 MG/ML IJ SOLN: 0.5000 mg | INTRAMUSCULAR | Status: DC | PRN

## 2019-03-04 MED ORDER — MEPERIDINE HCL 50 MG/ML IJ SOLN: 6.2500 mg | INTRAMUSCULAR | Status: DC | PRN

## 2019-03-04 MED ORDER — PHENOL 1.4 % MT LIQD: 1.0000 | OROMUCOSAL | Status: DC | PRN

## 2019-03-04 MED ORDER — DIPHENHYDRAMINE HCL 12.5 MG/5ML PO ELIX: 12.5000 mg | ORAL_SOLUTION | ORAL | Status: DC | PRN

## 2019-03-04 MED ORDER — DOCUSATE SODIUM 100 MG PO CAPS
100.0000 mg | ORAL_CAPSULE | Freq: Two times a day (BID) | ORAL | Status: DC
  Administered 2019-03-04: 100 mg via ORAL
  Filled 2019-03-04: qty 1

## 2019-03-04 MED ORDER — OXYCODONE HCL 5 MG PO TABS: 5.0000 mg | ORAL_TABLET | ORAL | Status: DC | PRN

## 2019-03-04 MED ORDER — HYDROCHLOROTHIAZIDE 25 MG PO TABS
25.0000 mg | ORAL_TABLET | Freq: Every evening | ORAL | Status: DC
  Administered 2019-03-04: 25 mg via ORAL
  Filled 2019-03-04: qty 1

## 2019-03-04 MED ORDER — ACETAMINOPHEN 500 MG PO TABS
1000.0000 mg | ORAL_TABLET | Freq: Once | ORAL | Status: AC
  Administered 2019-03-04: 06:00:00 1000 mg via ORAL
  Filled 2019-03-04: qty 2

## 2019-03-04 MED ORDER — LIDOCAINE HCL (CARDIAC) PF 100 MG/5ML IV SOSY
PREFILLED_SYRINGE | INTRAVENOUS | Status: DC | PRN
  Administered 2019-03-04: 80 mg via INTRAVENOUS

## 2019-03-04 MED ORDER — METHOCARBAMOL 1000 MG/10ML IJ SOLN
500.0000 mg | Freq: Four times a day (QID) | INTRAVENOUS | Status: DC | PRN
  Filled 2019-03-04: qty 5

## 2019-03-04 MED ORDER — BUPIVACAINE LIPOSOME 1.3 % IJ SUSP
INTRAMUSCULAR | Status: DC | PRN
  Administered 2019-03-04: 20 mL

## 2019-03-04 MED ORDER — LACTATED RINGERS IV SOLN
INTRAVENOUS | Status: DC
  Administered 2019-03-04 (×2): via INTRAVENOUS

## 2019-03-04 MED ORDER — ONDANSETRON HCL 4 MG PO TABS: 4.0000 mg | ORAL_TABLET | Freq: Four times a day (QID) | ORAL | Status: DC | PRN

## 2019-03-04 MED ORDER — HYDROMORPHONE HCL 1 MG/ML IJ SOLN: 0.2500 mg | INTRAMUSCULAR | Status: DC | PRN

## 2019-03-04 MED ORDER — 0.9 % SODIUM CHLORIDE (POUR BTL) OPTIME
TOPICAL | Status: DC | PRN
  Administered 2019-03-04: 1000 mL

## 2019-03-04 MED ORDER — METOCLOPRAMIDE HCL 5 MG/ML IJ SOLN: 5.0000 mg | Freq: Three times a day (TID) | INTRAMUSCULAR | Status: DC | PRN

## 2019-03-04 MED ORDER — BACLOFEN 10 MG PO TABS: 10.0000 mg | ORAL_TABLET | Freq: Three times a day (TID) | ORAL | 0 refills | Status: DC | PRN

## 2019-03-04 MED ORDER — PROPOFOL 10 MG/ML IV BOLUS
INTRAVENOUS | Status: AC
  Filled 2019-03-04: qty 60

## 2019-03-04 MED ORDER — BUPIVACAINE-EPINEPHRINE (PF) 0.5% -1:200000 IJ SOLN
INTRAMUSCULAR | Status: DC | PRN
  Administered 2019-03-04: 30 mL via PERINEURAL

## 2019-03-04 MED ORDER — EPHEDRINE 5 MG/ML INJ
INTRAVENOUS | Status: AC
  Filled 2019-03-04: qty 10

## 2019-03-04 MED ORDER — ACETAMINOPHEN 500 MG PO TABS
1000.0000 mg | ORAL_TABLET | Freq: Three times a day (TID) | ORAL | Status: DC
  Administered 2019-03-04 – 2019-03-05 (×3): 1000 mg via ORAL
  Filled 2019-03-04 (×3): qty 2

## 2019-03-04 MED ORDER — EPHEDRINE SULFATE-NACL 50-0.9 MG/10ML-% IV SOSY
PREFILLED_SYRINGE | INTRAVENOUS | Status: DC | PRN
  Administered 2019-03-04 (×5): 10 mg via INTRAVENOUS

## 2019-03-04 MED ORDER — POLYVINYL ALCOHOL 1.4 % OP SOLN: 1.0000 [drp] | Freq: Every day | OPHTHALMIC | Status: DC | PRN

## 2019-03-04 MED ORDER — ONDANSETRON HCL 4 MG/2ML IJ SOLN: 4.0000 mg | Freq: Once | INTRAMUSCULAR | Status: DC | PRN

## 2019-03-04 MED ORDER — STERILE WATER FOR IRRIGATION IR SOLN
Status: DC | PRN
  Administered 2019-03-04 (×2): 1000 mL

## 2019-03-04 MED ORDER — ATORVASTATIN CALCIUM 40 MG PO TABS
40.0000 mg | ORAL_TABLET | Freq: Every evening | ORAL | Status: DC
  Administered 2019-03-04: 40 mg via ORAL
  Filled 2019-03-04: qty 1

## 2019-03-04 MED ORDER — CEFAZOLIN SODIUM-DEXTROSE 1-4 GM/50ML-% IV SOLN
1.0000 g | Freq: Four times a day (QID) | INTRAVENOUS | Status: AC
  Administered 2019-03-04 (×2): 1 g via INTRAVENOUS
  Filled 2019-03-04 (×3): qty 50

## 2019-03-04 MED ORDER — LIDOCAINE 2% (20 MG/ML) 5 ML SYRINGE
INTRAMUSCULAR | Status: AC
  Filled 2019-03-04: qty 5

## 2019-03-04 MED ORDER — MENTHOL 3 MG MT LOZG: 1.0000 | LOZENGE | OROMUCOSAL | Status: DC | PRN

## 2019-03-04 MED ORDER — POLYETHYLENE GLYCOL 3350 17 G PO PACK: 17.0000 g | PACK | Freq: Every day | ORAL | Status: DC | PRN

## 2019-03-04 MED ORDER — SODIUM CHLORIDE 0.9% FLUSH
INTRAVENOUS | Status: DC | PRN
  Administered 2019-03-04: 30 mL

## 2019-03-04 MED ORDER — PHENYLEPHRINE HCL (PRESSORS) 10 MG/ML IV SOLN
INTRAVENOUS | Status: AC
  Filled 2019-03-04: qty 1

## 2019-03-04 SURGICAL SUPPLY — 46 items
BLADE HEX COATED 2.75 (ELECTRODE) ×2 IMPLANT
BLADE SAG 18X100X1.27 (BLADE) ×2 IMPLANT
BLADE SAGITTAL 25.0X1.37X90 (BLADE) ×2 IMPLANT
BLADE SURG 15 STRL LF DISP TIS (BLADE) ×1 IMPLANT
BLADE SURG 15 STRL SS (BLADE) ×1
BLADE SURG SZ10 CARB STEEL (BLADE) ×2 IMPLANT
BNDG ELASTIC 6X10 VLCR STRL LF (GAUZE/BANDAGES/DRESSINGS) ×2 IMPLANT
BOWL SMART MIX CTS (DISPOSABLE) IMPLANT
CHLORAPREP W/TINT 26 (MISCELLANEOUS) ×4 IMPLANT
CLSR STERI-STRIP ANTIMIC 1/2X4 (GAUZE/BANDAGES/DRESSINGS) ×2 IMPLANT
COVER SURGICAL LIGHT HANDLE (MISCELLANEOUS) ×2 IMPLANT
COVER WAND RF STERILE (DRAPES) IMPLANT
CUFF TOURN SGL QUICK 34 (TOURNIQUET CUFF) ×1
CUFF TRNQT CYL 34X4.125X (TOURNIQUET CUFF) ×1 IMPLANT
DECANTER SPIKE VIAL GLASS SM (MISCELLANEOUS) IMPLANT
DRAPE U-SHAPE 47X51 STRL (DRAPES) ×2 IMPLANT
DRSG MEPILEX BORDER 4X12 (GAUZE/BANDAGES/DRESSINGS) ×2 IMPLANT
FEMORAL POSTERIOR SZ7 LFT (Femur) IMPLANT
GLOVE BIO SURGEON STRL SZ7.5 (GLOVE) ×4 IMPLANT
GLOVE BIOGEL PI IND STRL 8 (GLOVE) ×2 IMPLANT
GLOVE BIOGEL PI INDICATOR 8 (GLOVE) ×2
GOWN STRL REUS W/ TWL LRG LVL3 (GOWN DISPOSABLE) ×2 IMPLANT
GOWN STRL REUS W/TWL LRG LVL3 (GOWN DISPOSABLE) ×2
HANDPIECE INTERPULSE COAX TIP (DISPOSABLE) ×1
HOLDER FOLEY CATH W/STRAP (MISCELLANEOUS) IMPLANT
IMMOBILIZER KNEE 22 UNIV (SOFTGOODS) ×3 IMPLANT
INSERT TIB BEARING SZ6 9 (Miscellaneous) ×1 IMPLANT
KIT TURNOVER KIT A (KITS) IMPLANT
KNEE PATELLA ASYMMETRIC 10X35 (Knees) ×1 IMPLANT
KNEE TIBIAL COMPONENT SZ6 (Knees) ×1 IMPLANT
MANIFOLD NEPTUNE II (INSTRUMENTS) ×2 IMPLANT
NS IRRIG 1000ML POUR BTL (IV SOLUTION) ×2 IMPLANT
PACK TOTAL KNEE CUSTOM (KITS) ×2 IMPLANT
PIN FLUTED HEDLESS FIX 3.5X1/8 (PIN) ×1 IMPLANT
POSTERIOR FEMORAL SZ7 LFT (Femur) ×2 IMPLANT
PROTECTOR NERVE ULNAR (MISCELLANEOUS) ×2 IMPLANT
SET HNDPC FAN SPRY TIP SCT (DISPOSABLE) ×1 IMPLANT
SUT MNCRL AB 4-0 PS2 18 (SUTURE) ×2 IMPLANT
SUT VIC AB 0 CT1 36 (SUTURE) ×2 IMPLANT
SUT VIC AB 1 CT1 36 (SUTURE) ×4 IMPLANT
SUT VIC AB 2-0 CT1 27 (SUTURE) ×2
SUT VIC AB 2-0 CT1 TAPERPNT 27 (SUTURE) ×1 IMPLANT
TRAY FOLEY MTR SLVR 14FR STAT (SET/KITS/TRAYS/PACK) IMPLANT
TRAY FOLEY MTR SLVR 16FR STAT (SET/KITS/TRAYS/PACK) ×2 IMPLANT
WRAP KNEE MAXI GEL POST OP (GAUZE/BANDAGES/DRESSINGS) ×1 IMPLANT
YANKAUER SUCT BULB TIP 10FT TU (MISCELLANEOUS) ×2 IMPLANT

## 2019-03-04 NOTE — Op Note (Signed)
DATE OF SURGERY:  03/04/2019 TIME: 8:51 AM  PATIENT NAME:  Richard Harmon   AGE: 68 y.o.    PRE-OPERATIVE DIAGNOSIS:  OA LEFT KNEE  POST-OPERATIVE DIAGNOSIS:  Same  PROCEDURE:  Procedure(s): TOTAL KNEE ARTHROPLASTY   SURGEON:  Renette Butters, MD   ASSISTANT:  Roxan Hockey, PA-C, he was present and scrubbed throughout the case, critical for completion in a timely fashion, and for retraction, instrumentation, and closure.    OPERATIVE IMPLANTS: [employer] Triathlon Posterior Stabilized. Press fit knee  Femur size 7, Tibia size 6, Patella size 35 3-peg oval button, with a 9 mm polyethylene insert.   PREOPERATIVE INDICATIONS:  [name] is a 68 y.o. year old male with end stage bone on bone degenerative arthritis of the knee who failed conservative treatment, including injections, antiinflammatories, activity modification, and assistive devices, and had significant impairment of their activities of daily living, and elected for Total Knee Arthroplasty.   The risks, benefits, and alternatives were discussed at length including but not limited to the risks of infection, bleeding, nerve injury, stiffness, blood clots, the need for revision surgery, cardiopulmonary complications, among others, and they were willing to proceed.   OPERATIVE DESCRIPTION:  The patient was brought to the operative room and placed in a supine position.  General anesthesia was administered.  IV antibiotics were given.  The lower extremity was prepped and draped in the usual sterile fashion.  Time out was performed.  The leg was elevated and exsanguinated and the tourniquet was inflated.  Anterior approach was performed.  The patella was everted and osteophytes were removed.  The anterior horn of the medial and lateral meniscus was removed.   The distal femur was opened with the drill and the intramedullary distal femoral cutting jig was utilized, set at 5 degrees resecting 8 mm off the distal femur.   Care was taken to protect the collateral ligaments.  The distal femoral sizing jig was applied, taking care to avoid notching.  Then the 4-in-1 cutting jig was applied and the anterior and posterior femur was cut, along with the chamfer cuts.  All posterior osteophytes were removed.  The flexion gap was then measured and was symmetric with the extension gap.  Then the extramedullary tibial cutting jig was utilized making the appropriate cut using the anterior tibial crest as a reference building in appropriate posterior slope.  Care was taken during the cut to protect the medial and collateral ligaments.  The proximal tibia was removed along with the posterior horns of the menisci.  The PCL was sacrificed.    The extensor gap was measured and was approximately 23mm.    I completed the distal femoral preparation using the appropriate jig to prepare the box.  The patella was then measured, and cut with the saw.    The proximal tibia sized and prepared accordingly with the reamer and the punch, and then all components were trialed with the above sized poly insert.  The knee was found to have excellent balance and full motion.    The above named components were then impacted into place and Poly tibial piece and patella were inserted.  I was very happy with his stability and ROM  I performed a periarticular injection with marcaine and toradol  The knee was easily taken through a range of motion and the patella tracked well and the knee irrigated copiously and the parapatellar and subcutaneous tissue closed with vicryl, and monocryl with steri strips for the skin.  The incision was dressed with sterile gauze and the tourniquet released and the patient was awakened and returned to the PACU in stable and satisfactory condition.  There were no complications.  Total tourniquet time was roughly 65 minutes.   POSTOPERATIVE PLAN: post op Abx, DVT px: SCD's, TED's, Early ambulation and chemical  px

## 2019-03-04 NOTE — Anesthesia Procedure Notes (Signed)
Spinal  Patient location during procedure: OR End time: 03/04/2019 7:40 AM Staffing Resident/CRNA: Caryl Pina T, CRNA Performed: resident/CRNA  Preanesthetic Checklist Completed: patient identified, site marked, surgical consent, pre-op evaluation, timeout performed, IV checked, risks and benefits discussed and monitors and equipment checked Spinal Block Patient position: sitting Prep: DuraPrep Patient monitoring: heart rate, cardiac monitor, continuous pulse ox and blood pressure Approach: midline Location: L3-4 Injection technique: single-shot Needle Needle type: Pencan  Needle gauge: 24 G Needle length: 9 cm Assessment Sensory level: T4 Additional Notes Expiration date of kit checked and confirmed. Patient tolerated procedure well, without complications.

## 2019-03-04 NOTE — Plan of Care (Signed)
progressing 

## 2019-03-04 NOTE — Transfer of Care (Signed)
Immediate Anesthesia Transfer of Care Note  Patient: Richard Harmon  Procedure(s) Performed: TOTAL KNEE ARTHROPLASTY (Left Knee)  Patient Location: PACU  Anesthesia Type:Spinal  Level of Consciousness: awake, alert , oriented and patient cooperative  Airway & Oxygen Therapy: Patient Spontanous Breathing and Patient connected to face mask oxygen  Post-op Assessment: Report given to RN, Post -op Vital signs reviewed and stable and Patient moving all extremities  Post vital signs: Reviewed and stable  Last Vitals:  Vitals Value Taken Time  BP 122/78 03/04/19 0933  Temp    Pulse 87 03/04/19 0939  Resp 16 03/04/19 0939  SpO2 97 % 03/04/19 0939  Vitals shown include unvalidated device data.  Last Pain:  Vitals:   03/04/19 0611  TempSrc: Oral  PainSc:          Complications: No apparent anesthesia complications

## 2019-03-04 NOTE — Evaluation (Signed)
Physical Therapy Evaluation Patient Details Name: Richard Harmon MRN: [id number] DOB: May 28, 1951 Today's Date: 03/04/2019   History of Present Illness  68 yo male s/p L TKA 6/30  Clinical Impression  On eval POD 0, pt was Min guard assist for mobility. He walked ~120 feet with a RW. Minimal pain with activity. Anticipate pt will progress well. Plan is for d/c home with HHPT f/u for first couple of weeks then transition to OP PT.     Follow Up Recommendations Follow surgeon's recommendation for DC plan and follow-up therapies(HHPT)    Equipment Recommendations  None recommended by PT    Recommendations for Other Services       Precautions / Restrictions Precautions Precautions: Fall Restrictions Weight Bearing Restrictions: No Other Position/Activity Restrictions: WBAT      Mobility  Bed Mobility Overal bed mobility: Needs Assistance Bed Mobility: Supine to Sit     Supine to sit: HOB elevated;Min guard     General bed mobility comments: close guard for safety, lines  Transfers Overall transfer level: Needs assistance Equipment used: Rolling walker (2 wheeled) Transfers: Sit to/from Stand Sit to Stand: Min guard         General transfer comment: close guard for safety. VCs safety, hand placement.  Ambulation/Gait Ambulation/Gait assistance: Min guard Gait Distance (Feet): 120 Feet Assistive device: Rolling walker (2 wheeled) Gait Pattern/deviations: Step-to pattern;Step-through pattern;Decreased stride length     General Gait Details: close guard for safety. VCs safety, sequence. Pt transitioned to reciiprocal gait pattern as distance increased.  Stairs            Wheelchair Mobility    Modified Rankin (Stroke Patients Only)       Balance Overall balance assessment: Mild deficits observed, not formally tested                                           Pertinent Vitals/Pain Pain Assessment: 0-10 Pain Score: 5  Pain  Location: L knee Pain Descriptors / Indicators: Aching;Sore Pain Intervention(s): Monitored during session;Ice applied    Home Living Family/patient expects to be discharged to:: Private residence Living Arrangements: Spouse/significant other Available Help at Discharge: Family Type of Home: House Home Access: Stairs to enter Entrance Stairs-Rails: Psychiatric nurse of Steps: 5 Home Layout: Able to live on main level with bedroom/bathroom;Two level Home Equipment: Walker - 2 wheels      Prior Function Level of Independence: Independent               Hand Dominance        Extremity/Trunk Assessment   Upper Extremity Assessment Upper Extremity Assessment: Overall WFL for tasks assessed    Lower Extremity Assessment Lower Extremity Assessment: (post op weakness 2* TKA)    Cervical / Trunk Assessment Cervical / Trunk Assessment: Normal  Communication   Communication: No difficulties  Cognition Arousal/Alertness: Awake/alert Behavior During Therapy: WFL for tasks assessed/performed Overall Cognitive Status: Within Functional Limits for tasks assessed                                        General Comments      Exercises     Assessment/Plan    PT Assessment Patient needs continued PT services  PT Problem List Decreased strength;Decreased range of motion;Decreased mobility;Decreased activity  tolerance;Decreased knowledge of use of DME       PT Treatment Interventions DME instruction;Gait training;Therapeutic activities;Therapeutic exercise;Patient/family education;Balance training;Functional mobility training;Stair training    PT Goals (Current goals can be found in the Care Plan section)  Acute Rehab PT Goals Patient Stated Goal: regain independence and PLOF PT Goal Formulation: With patient Time For Goal Achievement: 03/17/19 Potential to Achieve Goals: Good    Frequency 7X/week   Barriers to discharge         Co-evaluation               AM-PAC PT "6 Clicks" Mobility  Outcome Measure Help needed turning from your back to your side while in a flat bed without using bedrails?: A Little Help needed moving from lying on your back to sitting on the side of a flat bed without using bedrails?: A Little Help needed moving to and from a bed to a chair (including a wheelchair)?: A Little Help needed standing up from a chair using your arms (e.g., wheelchair or bedside chair)?: A Little Help needed to walk in hospital room?: A Little Help needed climbing 3-5 steps with a railing? : A Little 6 Click Score: 18    End of Session Equipment Utilized During Treatment: Gait belt Activity Tolerance: Patient tolerated treatment well Patient left: in chair;with call bell/phone within reach   PT Visit Diagnosis: Other abnormalities of gait and mobility (R26.89)    Time: 0388-8280 PT Time Calculation (min) (ACUTE ONLY): 21 min   Charges:   PT Evaluation $PT Eval Low Complexity: [place], PT Acute Rehabilitation Services Pager: [phone] Office: [phone]

## 2019-03-04 NOTE — Discharge Instructions (Signed)

## 2019-03-04 NOTE — Anesthesia Postprocedure Evaluation (Signed)
Anesthesia Post Note  Patient: Richard Harmon  Procedure(s) Performed: TOTAL KNEE ARTHROPLASTY (Left Knee)     Patient location during evaluation: PACU Anesthesia Type: Spinal Level of consciousness: awake and alert Pain management: pain level controlled Vital Signs Assessment: post-procedure vital signs reviewed and stable Respiratory status: spontaneous breathing Cardiovascular status: stable Anesthetic complications: no    Last Vitals:  Vitals:   03/04/19 1045 03/04/19 1100  BP: (!) 142/95 136/79  Pulse: 87 81  Resp: 12   Temp:    SpO2: 95% 95%    Last Pain:  Vitals:   03/04/19 1100  TempSrc:   PainSc: 0-No pain                 Nolon Nations

## 2019-03-04 NOTE — Interval H&P Note (Signed)
I participated in the care of this patient and agree with the above history, physical and evaluation. I performed a review of the history and a physical exam as detailed   Chiquitta Matty Daniel Deanette Tullius MD  

## 2019-03-04 NOTE — Anesthesia Procedure Notes (Signed)
Anesthesia Regional Block: Adductor canal block   Pre-Anesthetic Checklist: ,, timeout performed, Correct Patient, Correct Site, Correct Laterality, Correct Procedure, Correct Position, site marked, Risks and benefits discussed,  Surgical consent,  Pre-op evaluation,  At surgeon's request and post-op pain management  Laterality: Left  Prep: chloraprep       Needles:  Injection technique: Single-shot  Needle Type: Stimiplex     Needle Length: 9cm  Needle Gauge: 21     Additional Needles:   Procedures:,,,, ultrasound used (permanent image in chart),,,,  Narrative:  Start time: 03/04/2019 7:08 AM End time: 03/04/2019 7:13 AM Injection made incrementally with aspirations every 5 mL.  Performed by: Personally  Anesthesiologist: Nolon Nations, MD  Additional Notes: BP cuff, EKG monitors applied. Sedation begun. Artery and nerve location verified with U/S and anesthetic injected incrementally, slowly, and after negative aspirations under direct u/s guidance. Good fascial /perineural spread. Tolerated well.

## 2019-03-05 ENCOUNTER — Encounter (HOSPITAL_COMMUNITY): Payer: Self-pay | Admitting: Orthopedic Surgery

## 2019-03-05 NOTE — Progress Notes (Signed)
Physical Therapy Treatment Patient Details Name: Richard Harmon MRN: [id number] DOB: Dec 30, 1950 Today's Date: 03/05/2019    History of Present Illness 68 yo male s/p L TKA 6/30    PT Comments    Progressing well with mobility. Reviewed exercises, gait training, and stair training. All education completed. Okay to d/c from PT standpoint-RN made aware.   Follow Up Recommendations  Follow surgeon's recommendation for DC plan and follow-up therapies     Equipment Recommendations  None recommended by PT    Recommendations for Other Services       Precautions / Restrictions Precautions Precautions: Fall Restrictions Weight Bearing Restrictions: No Other Position/Activity Restrictions: WBAT    Mobility  Bed Mobility               General bed mobility comments: oob in recliner  Transfers Overall transfer level: Needs assistance Equipment used: Rolling walker (2 wheeled) Transfers: Sit to/from Stand Sit to Stand: Supervision         General transfer comment: for safety. VCs safety, hand placement.  Ambulation/Gait Ambulation/Gait assistance: Supervision Gait Distance (Feet): 150 Feet Assistive device: Rolling walker (2 wheeled) Gait Pattern/deviations: Step-to pattern;Step-through pattern;Decreased stride length     General Gait Details: for safety. VCs safety, sequence. Pt transitioned to reciprocal gait pattern as distance increased.   Stairs Stairs: Yes Stairs assistance: Min guard Stair Management: Step to pattern;Forwards;Two rails Number of Stairs: 5 General stair comments: up and over portable steps x 2. VCs safety, technique, sequence. Close guard for safety.   Wheelchair Mobility    Modified Rankin (Stroke Patients Only)       Balance Overall balance assessment: Mild deficits observed, not formally tested                                          Cognition Arousal/Alertness: Awake/alert Behavior During Therapy: WFL for  tasks assessed/performed Overall Cognitive Status: Within Functional Limits for tasks assessed                                        Exercises Total Joint Exercises Ankle Circles/Pumps: AROM;Both;10 reps;Seated Quad Sets: AAROM;Both;10 reps;Seated Heel Slides: AROM;Left;10 reps;Seated Hip ABduction/ADduction: AAROM;Left;10 reps;Seated Straight Leg Raises: AROM;Left;10 reps;Seated Long Arc Quad: AROM;Left;10 reps;Seated Knee Flexion: Left;10 reps;Seated;AROM    General Comments        Pertinent Vitals/Pain Pain Assessment: 0-10 Pain Score: 5  Pain Location: L knee Pain Descriptors / Indicators: Aching;Sore Pain Intervention(s): Monitored during session    Home Living                      Prior Function            PT Goals (current goals can now be found in the care plan section) Progress towards PT goals: Progressing toward goals    Frequency    7X/week      PT Plan Current plan remains appropriate    Co-evaluation              AM-PAC PT "6 Clicks" Mobility   Outcome Measure  Help needed turning from your back to your side while in a flat bed without using bedrails?: None Help needed moving from lying on your back to sitting on the side of a flat bed without using  bedrails?: None Help needed moving to and from a bed to a chair (including a wheelchair)?: A Little Help needed standing up from a chair using your arms (e.g., wheelchair or bedside chair)?: A Little Help needed to walk in hospital room?: A Little Help needed climbing 3-5 steps with a railing? : A Little 6 Click Score: 20    End of Session Equipment Utilized During Treatment: Gait belt Activity Tolerance: Patient tolerated treatment well Patient left: in bed;with call bell/phone within reach   PT Visit Diagnosis: Other abnormalities of gait and mobility (R26.89)     Time: 5027-7412 PT Time Calculation (min) (ACUTE ONLY): 22 min  Charges:  $Gait Training:  8-22 mins                        Weston Anna, [place] Pager: [phone] Office: [phone]

## 2019-03-05 NOTE — Discharge Summary (Signed)
Discharge Summary  Patient ID: Richard Harmon MRN: [id number] DOB/AGE: [birth year] 70 y.o.  Admit date: 03/04/2019 Discharge date: 03/05/2019  Admission Diagnoses:  Primary osteoarthritis of left knee  Discharge Diagnoses:  Principal Problem:   Primary osteoarthritis of left knee Active Problems:   Essential hypertension   Hyperlipidemia   Testosterone deficiency in male   S/P total knee arthroplasty   Past Medical History:  Diagnosis Date  . Arthritis   . Bronchitis 2018  . 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 Hypertension   . Pneumonia     Surgeries: Procedure(s): TOTAL KNEE ARTHROPLASTY on 03/04/2019   Consultants (if any):   Discharged Condition: Improved  Hospital Course: Richard Harmon is an 68 y.o. male who was admitted 03/04/2019 with a diagnosis of Primary osteoarthritis of left knee and went to the operating room on 03/04/2019 and underwent the above named procedures.    He was given perioperative antibiotics:  Anti-infectives (From admission, onward)   Start     Dose/Rate Route Frequency Ordered Stop   03/04/19 1400  ceFAZolin (ANCEF) IVPB 1 g/50 mL premix     1 g 100 mL/hr over 30 Minutes Intravenous Every 6 hours 03/04/19 1127 03/04/19 2050   03/04/19 0600  ceFAZolin (ANCEF) IVPB 2g/100 mL premix     2 g 200 mL/hr over 30 Minutes Intravenous On call to O.R. 03/04/19 0277 03/04/19 0744    .  He was given sequential compression devices, early ambulation, and aspirin for DVT prophylaxis.  He benefited maximally from the hospital stay and there were no complications.    Recent vital signs:  Vitals:   03/05/19 0108 03/05/19 0451  BP: 119/82 136/78  Pulse: 71 74  Resp: 16 16  Temp: 98.2 F (36.8 C) 98.2 F (36.8 C)  SpO2: 96% 94%    Recent laboratory studies:  Lab Results  Component Value Date   HGB 16.3 02/24/2019   Lab Results  Component Value Date   WBC 7.3 02/24/2019   PLT 219 02/24/2019   No results found for: INR Lab Results   Component Value Date   NA 140 02/24/2019   K 4.2 02/24/2019   CL 102 02/24/2019   CO2 28 02/24/2019   BUN 22 02/24/2019   CREATININE 0.87 02/24/2019   GLUCOSE 107 (H) 02/24/2019    Discharge Medications:   Allergies as of 03/05/2019   No Known Allergies     Medication List    STOP taking these medications   ibuprofen 200 MG tablet Commonly known as: ADVIL     TAKE these medications   acetaminophen 500 MG tablet Commonly known as: TYLENOL Take 2 tablets (1,000 mg total) by mouth every 8 (eight) hours for 10 days. For Pain. What changed:   medication strength  how much to take  when to take this  reasons to take this  additional instructions   amLODipine 5 MG tablet Commonly known as: NORVASC Take 5 mg by mouth every evening.   AndroGel 25 MG/2.5GM (1%) Gel Generic drug: Testosterone Place 1 application onto the skin daily.   aspirin EC 81 MG tablet Take 1 tablet (81 mg total) by mouth 2 (two) times daily. For DVT prophylaxis for 30 days after surgery. What changed:   when to take this  additional instructions   atorvastatin 40 MG tablet Commonly known as: LIPITOR Take 40 mg by mouth every evening.   baclofen 10 MG tablet Commonly known as: LIORESAL Take 1 tablet (10  mg total) by mouth 3 (three) times daily as needed for muscle spasms.   celecoxib 200 MG capsule Commonly known as: CeleBREX Take 1 capsule (200 mg total) by mouth 2 (two) times daily for 14 days. For 2 weeks post op for pain and inflammation.  Discontinue Ibuprofen or other Anti-inflammatory medicine when taking this medicine.   Dialyvite Vitamin D 5000 125 MCG (5000 UT) capsule Generic drug: Cholecalciferol Take 5,000 Units by mouth every evening.   docusate sodium 100 MG capsule Commonly known as: Colace Take 1 capsule (100 mg total) by mouth 2 (two) times daily. To prevent constipation while taking pain medication.   Krill Oil 500 MG Caps Take 500 mg by mouth every evening.    LUBRICATING EYE DROPS OP Place 1 drop into both eyes daily as needed (irritation).   Melatonin 10 MG Caps Take 10 mg by mouth at bedtime.   omeprazole 20 MG capsule Commonly known as: PriLOSEC Take 1 capsule (20 mg total) by mouth daily. 30 days for gastroprotection while taking NSAIDs.   ondansetron 4 MG tablet Commonly known as: Zofran Take 1 tablet (4 mg total) by mouth every 8 (eight) hours as needed for nausea or vomiting.   oxyCODONE 5 MG immediate release tablet Commonly known as: Roxicodone Take 1 tablet (5 mg total) by mouth every 4 (four) hours as needed for up to 7 days for breakthrough pain.   valsartan-hydrochlorothiazide 320-25 MG tablet Commonly known as: DIOVAN-HCT Take 1 tablet by mouth every evening.       Diagnostic Studies: Dg Knee Left Port  Result Date: 03/04/2019 CLINICAL DATA:  Status post left knee replacement EXAM: PORTABLE LEFT KNEE - 2 VIEW COMPARISON:  None. FINDINGS: Left knee replacement is noted in satisfactory position. No acute bony or soft tissue abnormality is seen. IMPRESSION: Status post left knee replacement. Electronically Signed   By: Inez Catalina M.D.   On: 03/04/2019 10:47    Disposition:     Follow-up Information    Richard Butters, MD.   Specialty: Orthopedic Surgery Contact information: [address] [place] [address] [phone]            Signed: [name] PA-C 03/05/2019, 7:48 AM

## 2019-03-05 NOTE — Progress Notes (Signed)
    Subjective: Patient reports pain as mild.  Tolerating diet.  Urinating.  +BM.  No CP, SOB.  Good early mobilization.  Ready to go home.  Objective:   VITALS:   Vitals:   03/04/19 1700 03/04/19 2100 03/05/19 0108 03/05/19 0451  BP: (!) 119/93 110/77 119/82 136/78  Pulse: 97 89 71 74  Resp: 17 16 16 16   Temp: 97.8 F (36.6 C) 99.1 F (37.3 C) 98.2 F (36.8 C) 98.2 F (36.8 C)  TempSrc: Oral     SpO2: 94% 92% 96% 94%  Weight:      Height:       CBC Latest Ref Rng & Units 02/24/2019  WBC 4.0 - 10.5 K/uL 7.3  Hemoglobin 13.0 - 17.0 g/dL 16.3  Hematocrit 39.0 - 52.0 % 53.0(H)  Platelets 150 - 400 K/uL 219   BMP Latest Ref Rng & Units 02/24/2019  Glucose 70 - 99 mg/dL 107(H)  BUN 8 - 23 mg/dL 22  Creatinine 0.61 - 1.24 mg/dL 0.87  Sodium 135 - 145 mmol/L 140  Potassium 3.5 - 5.1 mmol/L 4.2  Chloride 98 - 111 mmol/L 102  CO2 22 - 32 mmol/L 28  Calcium 8.9 - 10.3 mg/dL 9.4   Intake/Output      06/30 0701 - 07/01 0700 07/01 0701 - 07/02 0700   P.O. 480    I.V. (mL/kg) 3644.5 (41.7)    Other 0    IV Piggyback 299.9    Total Intake(mL/kg) 4424.4 (50.6)    Urine (mL/kg/hr) 2875 (1.4)    Stool 0    Blood 25    Total Output 2900    Net +1524.4         Stool Occurrence 1 x       Physical Exam: General: NAD.  Upright in chair on arrival.  Calm, conversant. Resp: No increased wob Cardio: regular rate and rhythm ABD soft Neurologically intact MSK LLE: Neurovascularly intact Sensation intact distally Feet warm Dorsiflexion/Plantar flexion intact Incision: dressing C/D/I   Assessment: 1 Day Post-Op  S/P Procedure(s) (LRB): TOTAL KNEE ARTHROPLASTY (Left) by Dr. Ernesta Amble. Percell Miller on 03/04/2019  Principal Problem:   Primary osteoarthritis of left knee Active Problems:   Essential hypertension   Hyperlipidemia   Testosterone deficiency in male   S/P total knee arthroplasty   Primary osteoarthritis, status post left total knee arthroplasty Doing well  postop day 1 Eating, drinking, and voiding Pain controlled Mobilizing well  Plan: Up with therapy D/C IV fluids Incentive Spirometry Elevate and Apply ice CPM, bone foam  Weight Bearing: Weight Bearing as Tolerated (WBAT) LLE Dressings: Maintain Mepilex.   VTE prophylaxis: Aspirin, SCDs, ambulation Dispo: Home today   Anticipated LOS less than 2 midnights.  Insurance approval for inpatient. - Age 67 and older with one or more of the following:  - Expected need for hospital services (PT, OT, Nursing) required for safe  discharge    [name], PA-C 03/05/2019, 7:44 AM

## 2019-10-05 ENCOUNTER — Ambulatory Visit: Payer: Medicare Other

## 2019-10-10 ENCOUNTER — Ambulatory Visit: Payer: Medicare PPO

## 2019-10-10 ENCOUNTER — Ambulatory Visit: Payer: Medicare Other

## 2019-12-11 DIAGNOSIS — Z01818 Encounter for other preprocedural examination: Secondary | ICD-10-CM | POA: Diagnosis not present

## 2019-12-11 DIAGNOSIS — I452 Bifascicular block: Secondary | ICD-10-CM | POA: Diagnosis not present

## 2019-12-11 DIAGNOSIS — M25561 Pain in right knee: Secondary | ICD-10-CM | POA: Diagnosis not present

## 2019-12-11 DIAGNOSIS — E785 Hyperlipidemia, unspecified: Secondary | ICD-10-CM | POA: Diagnosis not present

## 2019-12-11 DIAGNOSIS — I10 Essential (primary) hypertension: Secondary | ICD-10-CM | POA: Diagnosis not present

## 2019-12-19 DIAGNOSIS — H40053 Ocular hypertension, bilateral: Secondary | ICD-10-CM | POA: Diagnosis not present

## 2020-01-05 DIAGNOSIS — M25561 Pain in right knee: Secondary | ICD-10-CM | POA: Diagnosis not present

## 2020-01-05 DIAGNOSIS — Z01812 Encounter for preprocedural laboratory examination: Secondary | ICD-10-CM | POA: Diagnosis not present

## 2020-01-05 DIAGNOSIS — M1711 Unilateral primary osteoarthritis, right knee: Secondary | ICD-10-CM | POA: Diagnosis not present

## 2020-01-05 DIAGNOSIS — R791 Abnormal coagulation profile: Secondary | ICD-10-CM | POA: Diagnosis not present

## 2020-01-15 DIAGNOSIS — G8918 Other acute postprocedural pain: Secondary | ICD-10-CM | POA: Diagnosis not present

## 2020-01-15 DIAGNOSIS — M1711 Unilateral primary osteoarthritis, right knee: Secondary | ICD-10-CM | POA: Diagnosis not present

## 2020-01-16 DIAGNOSIS — R262 Difficulty in walking, not elsewhere classified: Secondary | ICD-10-CM | POA: Diagnosis not present

## 2020-01-16 DIAGNOSIS — M25661 Stiffness of right knee, not elsewhere classified: Secondary | ICD-10-CM | POA: Diagnosis not present

## 2020-01-16 DIAGNOSIS — M1711 Unilateral primary osteoarthritis, right knee: Secondary | ICD-10-CM | POA: Diagnosis not present

## 2020-01-16 DIAGNOSIS — Z96651 Presence of right artificial knee joint: Secondary | ICD-10-CM | POA: Diagnosis not present

## 2020-01-19 DIAGNOSIS — R262 Difficulty in walking, not elsewhere classified: Secondary | ICD-10-CM | POA: Diagnosis not present

## 2020-01-19 DIAGNOSIS — M25661 Stiffness of right knee, not elsewhere classified: Secondary | ICD-10-CM | POA: Diagnosis not present

## 2020-01-19 DIAGNOSIS — M1711 Unilateral primary osteoarthritis, right knee: Secondary | ICD-10-CM | POA: Diagnosis not present

## 2020-01-21 DIAGNOSIS — M25661 Stiffness of right knee, not elsewhere classified: Secondary | ICD-10-CM | POA: Diagnosis not present

## 2020-01-21 DIAGNOSIS — R262 Difficulty in walking, not elsewhere classified: Secondary | ICD-10-CM | POA: Diagnosis not present

## 2020-01-21 DIAGNOSIS — M1711 Unilateral primary osteoarthritis, right knee: Secondary | ICD-10-CM | POA: Diagnosis not present

## 2020-01-23 DIAGNOSIS — M1711 Unilateral primary osteoarthritis, right knee: Secondary | ICD-10-CM | POA: Diagnosis not present

## 2020-01-23 DIAGNOSIS — M25661 Stiffness of right knee, not elsewhere classified: Secondary | ICD-10-CM | POA: Diagnosis not present

## 2020-01-23 DIAGNOSIS — R262 Difficulty in walking, not elsewhere classified: Secondary | ICD-10-CM | POA: Diagnosis not present

## 2020-01-26 DIAGNOSIS — R262 Difficulty in walking, not elsewhere classified: Secondary | ICD-10-CM | POA: Diagnosis not present

## 2020-01-26 DIAGNOSIS — M1711 Unilateral primary osteoarthritis, right knee: Secondary | ICD-10-CM | POA: Diagnosis not present

## 2020-01-26 DIAGNOSIS — M25661 Stiffness of right knee, not elsewhere classified: Secondary | ICD-10-CM | POA: Diagnosis not present

## 2020-01-28 DIAGNOSIS — M25661 Stiffness of right knee, not elsewhere classified: Secondary | ICD-10-CM | POA: Diagnosis not present

## 2020-01-28 DIAGNOSIS — R262 Difficulty in walking, not elsewhere classified: Secondary | ICD-10-CM | POA: Diagnosis not present

## 2020-01-28 DIAGNOSIS — M1711 Unilateral primary osteoarthritis, right knee: Secondary | ICD-10-CM | POA: Diagnosis not present

## 2020-01-30 DIAGNOSIS — M1711 Unilateral primary osteoarthritis, right knee: Secondary | ICD-10-CM | POA: Diagnosis not present

## 2020-01-30 DIAGNOSIS — R262 Difficulty in walking, not elsewhere classified: Secondary | ICD-10-CM | POA: Diagnosis not present

## 2020-01-30 DIAGNOSIS — M25661 Stiffness of right knee, not elsewhere classified: Secondary | ICD-10-CM | POA: Diagnosis not present

## 2020-02-03 DIAGNOSIS — R262 Difficulty in walking, not elsewhere classified: Secondary | ICD-10-CM | POA: Diagnosis not present

## 2020-02-03 DIAGNOSIS — M25661 Stiffness of right knee, not elsewhere classified: Secondary | ICD-10-CM | POA: Diagnosis not present

## 2020-02-03 DIAGNOSIS — M1711 Unilateral primary osteoarthritis, right knee: Secondary | ICD-10-CM | POA: Diagnosis not present

## 2020-02-06 DIAGNOSIS — M25661 Stiffness of right knee, not elsewhere classified: Secondary | ICD-10-CM | POA: Diagnosis not present

## 2020-02-06 DIAGNOSIS — R262 Difficulty in walking, not elsewhere classified: Secondary | ICD-10-CM | POA: Diagnosis not present

## 2020-02-06 DIAGNOSIS — M1711 Unilateral primary osteoarthritis, right knee: Secondary | ICD-10-CM | POA: Diagnosis not present

## 2020-02-09 DIAGNOSIS — M1711 Unilateral primary osteoarthritis, right knee: Secondary | ICD-10-CM | POA: Diagnosis not present

## 2020-02-09 DIAGNOSIS — R262 Difficulty in walking, not elsewhere classified: Secondary | ICD-10-CM | POA: Diagnosis not present

## 2020-02-09 DIAGNOSIS — M25661 Stiffness of right knee, not elsewhere classified: Secondary | ICD-10-CM | POA: Diagnosis not present

## 2020-02-12 DIAGNOSIS — R262 Difficulty in walking, not elsewhere classified: Secondary | ICD-10-CM | POA: Diagnosis not present

## 2020-02-12 DIAGNOSIS — M1711 Unilateral primary osteoarthritis, right knee: Secondary | ICD-10-CM | POA: Diagnosis not present

## 2020-02-12 DIAGNOSIS — M25661 Stiffness of right knee, not elsewhere classified: Secondary | ICD-10-CM | POA: Diagnosis not present

## 2020-02-16 DIAGNOSIS — M25661 Stiffness of right knee, not elsewhere classified: Secondary | ICD-10-CM | POA: Diagnosis not present

## 2020-02-16 DIAGNOSIS — R262 Difficulty in walking, not elsewhere classified: Secondary | ICD-10-CM | POA: Diagnosis not present

## 2020-02-16 DIAGNOSIS — M1711 Unilateral primary osteoarthritis, right knee: Secondary | ICD-10-CM | POA: Diagnosis not present

## 2020-02-19 DIAGNOSIS — R262 Difficulty in walking, not elsewhere classified: Secondary | ICD-10-CM | POA: Diagnosis not present

## 2020-02-19 DIAGNOSIS — M1711 Unilateral primary osteoarthritis, right knee: Secondary | ICD-10-CM | POA: Diagnosis not present

## 2020-02-19 DIAGNOSIS — M25661 Stiffness of right knee, not elsewhere classified: Secondary | ICD-10-CM | POA: Diagnosis not present

## 2020-02-23 DIAGNOSIS — M1711 Unilateral primary osteoarthritis, right knee: Secondary | ICD-10-CM | POA: Diagnosis not present

## 2020-02-23 DIAGNOSIS — R262 Difficulty in walking, not elsewhere classified: Secondary | ICD-10-CM | POA: Diagnosis not present

## 2020-02-23 DIAGNOSIS — M25661 Stiffness of right knee, not elsewhere classified: Secondary | ICD-10-CM | POA: Diagnosis not present

## 2020-02-26 DIAGNOSIS — M1711 Unilateral primary osteoarthritis, right knee: Secondary | ICD-10-CM | POA: Diagnosis not present

## 2020-02-26 DIAGNOSIS — M25661 Stiffness of right knee, not elsewhere classified: Secondary | ICD-10-CM | POA: Diagnosis not present

## 2020-02-26 DIAGNOSIS — R262 Difficulty in walking, not elsewhere classified: Secondary | ICD-10-CM | POA: Diagnosis not present

## 2020-03-01 DIAGNOSIS — M1711 Unilateral primary osteoarthritis, right knee: Secondary | ICD-10-CM | POA: Diagnosis not present

## 2020-03-01 DIAGNOSIS — R262 Difficulty in walking, not elsewhere classified: Secondary | ICD-10-CM | POA: Diagnosis not present

## 2020-03-01 DIAGNOSIS — M25661 Stiffness of right knee, not elsewhere classified: Secondary | ICD-10-CM | POA: Diagnosis not present

## 2020-03-10 DIAGNOSIS — E291 Testicular hypofunction: Secondary | ICD-10-CM | POA: Diagnosis not present

## 2020-03-10 DIAGNOSIS — N400 Enlarged prostate without lower urinary tract symptoms: Secondary | ICD-10-CM | POA: Diagnosis not present

## 2020-03-10 DIAGNOSIS — N401 Enlarged prostate with lower urinary tract symptoms: Secondary | ICD-10-CM | POA: Diagnosis not present

## 2020-03-11 DIAGNOSIS — M25661 Stiffness of right knee, not elsewhere classified: Secondary | ICD-10-CM | POA: Diagnosis not present

## 2020-03-11 DIAGNOSIS — R262 Difficulty in walking, not elsewhere classified: Secondary | ICD-10-CM | POA: Diagnosis not present

## 2020-03-11 DIAGNOSIS — M1711 Unilateral primary osteoarthritis, right knee: Secondary | ICD-10-CM | POA: Diagnosis not present

## 2020-03-16 DIAGNOSIS — E291 Testicular hypofunction: Secondary | ICD-10-CM | POA: Diagnosis not present

## 2020-03-16 DIAGNOSIS — N400 Enlarged prostate without lower urinary tract symptoms: Secondary | ICD-10-CM | POA: Diagnosis not present

## 2020-03-19 DIAGNOSIS — H524 Presbyopia: Secondary | ICD-10-CM | POA: Diagnosis not present

## 2020-07-09 DIAGNOSIS — Z85828 Personal history of other malignant neoplasm of skin: Secondary | ICD-10-CM | POA: Diagnosis not present

## 2020-07-09 DIAGNOSIS — L570 Actinic keratosis: Secondary | ICD-10-CM | POA: Diagnosis not present

## 2020-07-09 DIAGNOSIS — L821 Other seborrheic keratosis: Secondary | ICD-10-CM | POA: Diagnosis not present

## 2020-07-09 DIAGNOSIS — L578 Other skin changes due to chronic exposure to nonionizing radiation: Secondary | ICD-10-CM | POA: Diagnosis not present

## 2020-07-09 DIAGNOSIS — L905 Scar conditions and fibrosis of skin: Secondary | ICD-10-CM | POA: Diagnosis not present

## 2020-07-09 DIAGNOSIS — L738 Other specified follicular disorders: Secondary | ICD-10-CM | POA: Diagnosis not present

## 2020-07-15 DIAGNOSIS — H40053 Ocular hypertension, bilateral: Secondary | ICD-10-CM | POA: Diagnosis not present

## 2020-07-19 DIAGNOSIS — I10 Essential (primary) hypertension: Secondary | ICD-10-CM | POA: Diagnosis not present

## 2020-07-19 DIAGNOSIS — Z1211 Encounter for screening for malignant neoplasm of colon: Secondary | ICD-10-CM | POA: Diagnosis not present

## 2020-07-19 DIAGNOSIS — Z0000 Encounter for general adult medical examination without abnormal findings: Secondary | ICD-10-CM | POA: Diagnosis not present

## 2020-07-19 DIAGNOSIS — L409 Psoriasis, unspecified: Secondary | ICD-10-CM | POA: Diagnosis not present

## 2020-07-19 DIAGNOSIS — R7309 Other abnormal glucose: Secondary | ICD-10-CM | POA: Diagnosis not present

## 2020-07-19 DIAGNOSIS — E291 Testicular hypofunction: Secondary | ICD-10-CM | POA: Diagnosis not present

## 2020-07-19 DIAGNOSIS — E785 Hyperlipidemia, unspecified: Secondary | ICD-10-CM | POA: Diagnosis not present

## 2020-07-19 DIAGNOSIS — Z23 Encounter for immunization: Secondary | ICD-10-CM | POA: Diagnosis not present

## 2020-07-20 DIAGNOSIS — Z1211 Encounter for screening for malignant neoplasm of colon: Secondary | ICD-10-CM | POA: Diagnosis not present

## 2020-07-28 DIAGNOSIS — R7309 Other abnormal glucose: Secondary | ICD-10-CM | POA: Diagnosis not present

## 2020-07-28 DIAGNOSIS — E785 Hyperlipidemia, unspecified: Secondary | ICD-10-CM | POA: Diagnosis not present

## 2020-07-28 DIAGNOSIS — Z0000 Encounter for general adult medical examination without abnormal findings: Secondary | ICD-10-CM | POA: Diagnosis not present

## 2020-11-03 DIAGNOSIS — Z85828 Personal history of other malignant neoplasm of skin: Secondary | ICD-10-CM | POA: Diagnosis not present

## 2020-11-03 DIAGNOSIS — L905 Scar conditions and fibrosis of skin: Secondary | ICD-10-CM | POA: Diagnosis not present

## 2020-11-03 DIAGNOSIS — L814 Other melanin hyperpigmentation: Secondary | ICD-10-CM | POA: Diagnosis not present

## 2020-11-03 DIAGNOSIS — D229 Melanocytic nevi, unspecified: Secondary | ICD-10-CM | POA: Diagnosis not present

## 2020-11-03 DIAGNOSIS — L738 Other specified follicular disorders: Secondary | ICD-10-CM | POA: Diagnosis not present

## 2020-11-03 DIAGNOSIS — L821 Other seborrheic keratosis: Secondary | ICD-10-CM | POA: Diagnosis not present

## 2020-11-03 DIAGNOSIS — L570 Actinic keratosis: Secondary | ICD-10-CM | POA: Diagnosis not present

## 2020-11-09 DIAGNOSIS — S53402A Unspecified sprain of left elbow, initial encounter: Secondary | ICD-10-CM | POA: Diagnosis not present

## 2020-11-12 DIAGNOSIS — H40013 Open angle with borderline findings, low risk, bilateral: Secondary | ICD-10-CM | POA: Diagnosis not present

## 2020-12-20 DIAGNOSIS — H6980 Other specified disorders of Eustachian tube, unspecified ear: Secondary | ICD-10-CM | POA: Diagnosis not present

## 2020-12-20 DIAGNOSIS — J069 Acute upper respiratory infection, unspecified: Secondary | ICD-10-CM | POA: Diagnosis not present

## 2021-01-17 DIAGNOSIS — M545 Low back pain, unspecified: Secondary | ICD-10-CM | POA: Diagnosis not present

## 2021-01-18 DIAGNOSIS — R7309 Other abnormal glucose: Secondary | ICD-10-CM | POA: Diagnosis not present

## 2021-01-18 DIAGNOSIS — E785 Hyperlipidemia, unspecified: Secondary | ICD-10-CM | POA: Diagnosis not present

## 2021-01-18 DIAGNOSIS — I10 Essential (primary) hypertension: Secondary | ICD-10-CM | POA: Diagnosis not present

## 2021-01-25 DIAGNOSIS — S39012D Strain of muscle, fascia and tendon of lower back, subsequent encounter: Secondary | ICD-10-CM | POA: Diagnosis not present

## 2021-01-25 DIAGNOSIS — M545 Low back pain, unspecified: Secondary | ICD-10-CM | POA: Diagnosis not present

## 2021-02-21 DIAGNOSIS — Z1211 Encounter for screening for malignant neoplasm of colon: Secondary | ICD-10-CM | POA: Diagnosis not present

## 2021-02-21 DIAGNOSIS — K5730 Diverticulosis of large intestine without perforation or abscess without bleeding: Secondary | ICD-10-CM | POA: Diagnosis not present

## 2021-03-10 DIAGNOSIS — R948 Abnormal results of function studies of other organs and systems: Secondary | ICD-10-CM | POA: Diagnosis not present

## 2021-03-10 DIAGNOSIS — E291 Testicular hypofunction: Secondary | ICD-10-CM | POA: Diagnosis not present

## 2021-03-25 DIAGNOSIS — H2511 Age-related nuclear cataract, right eye: Secondary | ICD-10-CM | POA: Diagnosis not present

## 2021-04-04 DIAGNOSIS — Z8616 Personal history of COVID-19: Secondary | ICD-10-CM

## 2021-04-04 HISTORY — DX: Personal history of COVID-19: Z86.16

## 2021-04-05 DIAGNOSIS — U071 COVID-19: Secondary | ICD-10-CM | POA: Diagnosis not present

## 2021-04-05 DIAGNOSIS — R059 Cough, unspecified: Secondary | ICD-10-CM | POA: Diagnosis not present

## 2021-04-05 DIAGNOSIS — R5383 Other fatigue: Secondary | ICD-10-CM | POA: Diagnosis not present

## 2021-04-05 DIAGNOSIS — M791 Myalgia, unspecified site: Secondary | ICD-10-CM | POA: Diagnosis not present

## 2021-04-14 DIAGNOSIS — N400 Enlarged prostate without lower urinary tract symptoms: Secondary | ICD-10-CM | POA: Diagnosis not present

## 2021-04-14 DIAGNOSIS — E291 Testicular hypofunction: Secondary | ICD-10-CM | POA: Diagnosis not present

## 2021-04-15 DIAGNOSIS — B9721 SARS-associated coronavirus as the cause of diseases classified elsewhere: Secondary | ICD-10-CM | POA: Diagnosis not present

## 2021-04-15 DIAGNOSIS — U071 COVID-19: Secondary | ICD-10-CM | POA: Diagnosis not present

## 2021-05-10 DIAGNOSIS — Z08 Encounter for follow-up examination after completed treatment for malignant neoplasm: Secondary | ICD-10-CM | POA: Diagnosis not present

## 2021-05-10 DIAGNOSIS — D2239 Melanocytic nevi of other parts of face: Secondary | ICD-10-CM | POA: Diagnosis not present

## 2021-05-10 DIAGNOSIS — Z85828 Personal history of other malignant neoplasm of skin: Secondary | ICD-10-CM | POA: Diagnosis not present

## 2021-05-10 DIAGNOSIS — L819 Disorder of pigmentation, unspecified: Secondary | ICD-10-CM | POA: Diagnosis not present

## 2021-05-10 DIAGNOSIS — L821 Other seborrheic keratosis: Secondary | ICD-10-CM | POA: Diagnosis not present

## 2021-06-26 DIAGNOSIS — H60312 Diffuse otitis externa, left ear: Secondary | ICD-10-CM | POA: Diagnosis not present

## 2021-06-26 DIAGNOSIS — R0981 Nasal congestion: Secondary | ICD-10-CM | POA: Diagnosis not present

## 2021-06-28 DIAGNOSIS — H6983 Other specified disorders of Eustachian tube, bilateral: Secondary | ICD-10-CM | POA: Diagnosis not present

## 2021-06-28 DIAGNOSIS — H66002 Acute suppurative otitis media without spontaneous rupture of ear drum, left ear: Secondary | ICD-10-CM | POA: Diagnosis not present

## 2021-06-28 DIAGNOSIS — H60312 Diffuse otitis externa, left ear: Secondary | ICD-10-CM | POA: Diagnosis not present

## 2021-07-09 DIAGNOSIS — H6503 Acute serous otitis media, bilateral: Secondary | ICD-10-CM | POA: Diagnosis not present

## 2021-07-09 DIAGNOSIS — R0981 Nasal congestion: Secondary | ICD-10-CM | POA: Diagnosis not present

## 2021-07-13 DIAGNOSIS — H903 Sensorineural hearing loss, bilateral: Secondary | ICD-10-CM | POA: Diagnosis not present

## 2021-07-13 HISTORY — DX: Sensorineural hearing loss, bilateral: H90.3

## 2021-08-01 DIAGNOSIS — H938X2 Other specified disorders of left ear: Secondary | ICD-10-CM | POA: Insufficient documentation

## 2021-08-01 DIAGNOSIS — H903 Sensorineural hearing loss, bilateral: Secondary | ICD-10-CM | POA: Diagnosis not present

## 2021-08-01 HISTORY — DX: Other specified disorders of left ear: H93.8X2

## 2021-08-03 DIAGNOSIS — R413 Other amnesia: Secondary | ICD-10-CM | POA: Diagnosis not present

## 2021-08-03 DIAGNOSIS — E291 Testicular hypofunction: Secondary | ICD-10-CM | POA: Diagnosis not present

## 2021-08-03 DIAGNOSIS — R7309 Other abnormal glucose: Secondary | ICD-10-CM | POA: Diagnosis not present

## 2021-08-03 DIAGNOSIS — Z0000 Encounter for general adult medical examination without abnormal findings: Secondary | ICD-10-CM | POA: Diagnosis not present

## 2021-08-03 DIAGNOSIS — I10 Essential (primary) hypertension: Secondary | ICD-10-CM | POA: Diagnosis not present

## 2021-08-03 DIAGNOSIS — E785 Hyperlipidemia, unspecified: Secondary | ICD-10-CM | POA: Diagnosis not present

## 2021-09-12 DIAGNOSIS — H903 Sensorineural hearing loss, bilateral: Secondary | ICD-10-CM | POA: Diagnosis not present

## 2021-09-12 DIAGNOSIS — H938X2 Other specified disorders of left ear: Secondary | ICD-10-CM | POA: Diagnosis not present

## 2021-11-08 DIAGNOSIS — D492 Neoplasm of unspecified behavior of bone, soft tissue, and skin: Secondary | ICD-10-CM | POA: Diagnosis not present

## 2021-11-08 DIAGNOSIS — L570 Actinic keratosis: Secondary | ICD-10-CM | POA: Diagnosis not present

## 2021-11-08 DIAGNOSIS — Z85828 Personal history of other malignant neoplasm of skin: Secondary | ICD-10-CM | POA: Diagnosis not present

## 2021-11-08 DIAGNOSIS — D2239 Melanocytic nevi of other parts of face: Secondary | ICD-10-CM | POA: Diagnosis not present

## 2021-11-08 DIAGNOSIS — Z08 Encounter for follow-up examination after completed treatment for malignant neoplasm: Secondary | ICD-10-CM | POA: Diagnosis not present

## 2021-11-08 DIAGNOSIS — L814 Other melanin hyperpigmentation: Secondary | ICD-10-CM | POA: Diagnosis not present

## 2021-11-08 DIAGNOSIS — L821 Other seborrheic keratosis: Secondary | ICD-10-CM | POA: Diagnosis not present

## 2021-12-12 DIAGNOSIS — H903 Sensorineural hearing loss, bilateral: Secondary | ICD-10-CM | POA: Diagnosis not present

## 2021-12-19 DIAGNOSIS — H903 Sensorineural hearing loss, bilateral: Secondary | ICD-10-CM | POA: Diagnosis not present

## 2021-12-19 DIAGNOSIS — L089 Local infection of the skin and subcutaneous tissue, unspecified: Secondary | ICD-10-CM | POA: Diagnosis not present

## 2022-01-10 DIAGNOSIS — R059 Cough, unspecified: Secondary | ICD-10-CM | POA: Diagnosis not present

## 2022-01-10 DIAGNOSIS — J329 Chronic sinusitis, unspecified: Secondary | ICD-10-CM | POA: Diagnosis not present

## 2022-01-18 ENCOUNTER — Encounter: Payer: Self-pay | Admitting: Physician Assistant

## 2022-01-18 ENCOUNTER — Ambulatory Visit: Payer: Medicare PPO | Admitting: Physician Assistant

## 2022-01-18 VITALS — BP 158/81 | HR 72 | Resp 18 | Ht 69.0 in | Wt 186.0 lb

## 2022-01-18 DIAGNOSIS — R413 Other amnesia: Secondary | ICD-10-CM | POA: Diagnosis not present

## 2022-01-18 DIAGNOSIS — G3184 Mild cognitive impairment, so stated: Secondary | ICD-10-CM

## 2022-01-18 NOTE — Patient Instructions (Addendum)
?It was a pleasure to see you today at our office.  ? ?Recommendations: ? ?MRI of the brain, the radiology office will call you to arrange you appointment ?Follow up in  ? ?Whom to call: ? ?Memory  decline, memory medications: Call out office [phone]  ? ?For psychiatric meds, mood meds: Please have your primary care physician manage these medications.  ? ? ?Feel free to go to the following database for funded clinical studies conducted around the world: ?http://saunders.com/   https://www.triadclinicaltrials.com/ ?  ? ? ?RECOMMENDATIONS FOR ALL PATIENTS WITH MEMORY PROBLEMS: ?1. Continue to exercise (Recommend 30 minutes of walking everyday, or 3 hours every week) ?2. Increase social interactions - continue going to [place] and enjoy social gatherings with friends and family ?3. Eat healthy, avoid fried foods and eat more fruits and vegetables ?4. Maintain adequate blood pressure, blood sugar, and blood cholesterol level. Reducing the risk of stroke and cardiovascular disease also helps promoting better memory. ?5. Avoid stressful situations. Live a simple life and avoid aggravations. Organize your time and prepare for the next day in anticipation. ?6. Sleep well, avoid any interruptions of sleep and avoid any distractions in the bedroom that may interfere with adequate sleep quality ?7. Avoid sugar, avoid sweets as there is a strong link between excessive sugar intake, diabetes, and cognitive impairment ?We discussed the Mediterranean diet, which has been shown to help patients reduce the risk of progressive memory disorders and reduces cardiovascular risk. This includes eating fish, eat fruits and green leafy vegetables, nuts like almonds and hazelnuts, walnuts, and also use olive oil. Avoid fast foods and fried foods as much as possible. Avoid sweets and sugar as sugar use has been linked to worsening of memory function. ? ?There is always a concern of gradual progression of memory problems. If this  is the case, then we may need to adjust level of care according to patient needs. Support, both to the patient and caregiver, should then be put into place.  ? ? ? ? ?You have been referred for a neuropsychological evaluation (i.e., evaluation of memory and thinking abilities). Please bring someone with you to this appointment if possible, as it is helpful for the doctor to hear from both you and another adult who knows you well. Please bring eyeglasses and hearing aids if you wear them.  ?  ?The evaluation will take approximately 3 hours and has two parts: ?  ?The first part is a clinical interview with the neuropsychologist (Dr. Melvyn Novas or Dr. Nicole Kindred). During the interview, the neuropsychologist will speak with you and the individual you brought to the appointment.  ?  ?The second part of the evaluation is testing with the doctor's technician Hinton Dyer or Maudie Mercury). During the testing, the technician will ask you to remember different types of material, solve problems, and answer some questionnaires. Your family member will not be present for this portion of the evaluation. ?  ?Please note: We must reserve several hours of the neuropsychologist's time and the psychometrician's time for your evaluation appointment. As such, there is a No-Show fee of $100. If you are unable to attend any of your appointments, please contact our office as soon as possible to reschedule.  ? ? ?FALL PRECAUTIONS: Be cautious when walking. Scan the area for obstacles that may increase the risk of trips and falls. When getting up in the mornings, sit up at the edge of the bed for a few minutes before getting out of bed. Consider elevating the bed at  the head end to avoid drop of blood pressure when getting up. Walk always in a well-lit room (use night lights in the walls). Avoid area rugs or power cords from appliances in the middle of the walkways. Use a walker or a cane if necessary and consider physical therapy for balance exercise. Get your  eyesight checked regularly. ? ?FINANCIAL OVERSIGHT: Supervision, especially oversight when making financial decisions or transactions is also recommended. ? ?HOME SAFETY: Consider the safety of the kitchen when operating appliances like stoves, microwave oven, and blender. Consider having supervision and share cooking responsibilities until no longer able to participate in those. Accidents with firearms and other hazards in the house should be identified and addressed as well. ? ? ?ABILITY TO BE LEFT ALONE: If patient is unable to contact 911 operator, consider using LifeLine, or when the need is there, arrange for someone to stay with patients. Smoking is a fire hazard, consider supervision or cessation. Risk of wandering should be assessed by caregiver and if detected at any point, supervision and safe proof recommendations should be instituted. ? ?MEDICATION SUPERVISION: Inability to self-administer medication needs to be constantly addressed. Implement a mechanism to ensure safe administration of the medications. ? ? ?DRIVING: Regarding driving, in patients with progressive memory problems, driving will be impaired. We advise to have someone else do the driving if trouble finding directions or if minor accidents are reported. Independent driving assessment is available to determine safety of driving. ? ? ?If you are interested in the driving assessment, you can contact the following: ? ?[facility] [employer] in [place] ? ?[place] [phone] ? ?[facility] [phone] ? ?[facility] [phone] or [phone] ? ? ? ?Mediterranean Diet ?A Mediterranean diet refers to food and lifestyle choices that are based on the traditions of countries located on the [employer]. This way of eating has been shown to help prevent certain conditions and improve outcomes for people who have chronic diseases, like kidney disease and heart disease. ?What are tips  for following this plan? ?Lifestyle  ?Cook and eat meals together with your family, when possible. ?Drink enough fluid to keep your urine clear or pale yellow. ?Be physically active every day. This includes: ?Aerobic exercise like running or swimming. ?Leisure activities like gardening, walking, or housework. ?Get 7-8 hours of sleep each night. ?If recommended by your health care provider, drink red wine in moderation. This means 1 glass a day for nonpregnant women and 2 glasses a day for men. A glass of wine equals 5 oz (150 mL). ?Reading food labels  ?Check the serving size of packaged foods. For foods such as rice and pasta, the serving size refers to the amount of cooked product, not dry. ?Check the total fat in packaged foods. Avoid foods that have saturated fat or trans fats. ?Check the ingredients list for added sugars, such as corn syrup. ?Shopping  ?At the grocery store, buy most of your food from the areas near the walls of the store. This includes: ?Fresh fruits and vegetables (produce). ?Grains, beans, nuts, and seeds. Some of these may be available in unpackaged forms or large amounts (in bulk). ?Fresh seafood. ?Poultry and eggs. ?Low-fat dairy products. ?Buy whole ingredients instead of prepackaged foods. ?Buy fresh fruits and vegetables in-season from local farmers markets. ?Buy frozen fruits and vegetables in resealable bags. ?If you do not have access to quality fresh seafood, buy precooked frozen shrimp or canned fish, such as tuna, salmon, or sardines. ?Buy small  amounts of raw or cooked vegetables, salads, or olives from the deli or salad bar at your store. ?Stock your pantry so you always have certain foods on hand, such as olive oil, canned tuna, canned tomatoes, rice, pasta, and beans. ?Cooking  ?Cook foods with extra-virgin olive oil instead of using butter or other vegetable oils. ?Have meat as a side dish, and have vegetables or grains as your main dish. This means having meat in small  portions or adding small amounts of meat to foods like pasta or stew. ?Use beans or vegetables instead of meat in common dishes like chili or lasagna. ?Experiment with different cooking methods. Try roasting or

## 2022-01-18 NOTE — Progress Notes (Signed)
Assessment/Plan:   Richard Harmon is a very pleasant 71 y.o. year old RH male with  a history of hypertension, hyperlipidemia, decreased hearing, hyperglycemia, low testosterone on Androgel, psoriasis, seen today for evaluation of memory loss. MoCA today is 21/30 with delayed recall 0/5.  Patient reports that he had COVID late last year, and that while at that time he was having "foggy brain ", symptoms are ameliorated.  He also began to take B12 supplements, and he reports that he is feeling better.  Although at times, COVID may mimic symptoms of postconcussion syndrome, in view of his MoCA score, further work-up is indicated.    Recommendations:   MIld Cognitive Impairment  MRI brain with/without contrast to assess for underlying structural abnormality and assess vascular load  Continue to replenish B12 1000 mcg daily If any abnormalities are noted on the MRI of the brain, will entertain [facility] Follow-up in 1 month, at which time we will review the results of the MRI.  Subjective:    The patient is seen in neurologic consultation at the request of London Pepper, MD for the evaluation of memory.  The patient is here alone.    How long did patient have memory difficulties?  "Before Covid I was ok, then developed brain fog until early this year, then took B12 and over the last couple of months I feel great " repeats oneself? Occassionally  Disoriented when walking into a room?  Patient denies   Leaving objects in unusual places?  Patient denies   Ambulates  with difficulty?   Patient denies. He walks 4-5 times a week   Recent falls?  Patient denies   Any head injuries?  Patient denies   History of seizures?   Patient denies   Wandering behavior?  Patient denies   Patient drives?  "  I never get lost. Patient uses GPS to drive " Any mood changes such irritability agitation?  Patient denies   Any history of depression?:  Patient denies   Hallucinations?  Patient denies   Paranoia?   Patient denies   Patient reports that he sleeps " I have always been an early riser " Denies vivid dreams, REM behavior or sleepwalking    History of sleep apnea?  Patient denies   Any hygiene concerns?  Patient denies   Independent of bathing and dressing?  Endorsed  Does the patient needs help with medications?   Patient denies   Who is in charge of the finances?  Patient is in charge   Any changes in appetite?  Patient denies. Put on some weight because of late night snacks  Patient have trouble swallowing? Patient denies   Does the patient cook?  Patient denies   Any kitchen accidents such as leaving the stove on? Patient denies   Any headaches?  Patient denies   The double vision? Patient denies   Any focal numbness or tingling?  Patient denies   Chronic back pain Patient denies   Unilateral weakness?  Patient denies   Any tremors?  Patient denies   Any history of anosmia?  Patient denies   Any incontinence of urine?  Patient denies   Any bowel dysfunction?   Patient denies     History of heavy alcohol intake?  Patient denies   History of heavy tobacco use?  Patient denies   Family history of dementia?  Patient denies  Mo died in December 14, 1992 , she had brain multiinfarct .  Worked at [employer] retired in  October 2022, shutting the program down had impact on him.  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, volunteers at [facility] and goes to the [facility] [employer]   Pertinent  labs:  TSH 1.45, A1c 5.8, B12 288    Past Medical History:  Diagnosis Date   Arthritis    Bronchitis 2018   Cancer ([place]) 07/04/2018   Basal Cell    Hypertension    Pneumonia      Past Surgical History:  Procedure Laterality Date   BASAL CELL CARCINOMA EXCISION  06/2017   BICEPS TENDON REPAIR  07/05/2018   CATARACT EXTRACTION Left 10/05/2009   HERNIA REPAIR  2007   TONSILLECTOMY     TOTAL KNEE ARTHROPLASTY Left 03/04/2019   Procedure: TOTAL KNEE ARTHROPLASTY;  Surgeon: Renette Butters, MD;  Location: [facility];  Service: Orthopedics;  Laterality: Left;     No Known Allergies  Current Outpatient Medications  Medication Instructions   amLODipine (NORVASC) 5 mg, Oral, Every evening   aspirin EC 81 mg, Oral, 2 times daily, For DVT prophylaxis for 30 days after surgery.   atorvastatin (LIPITOR) 40 mg, Oral, Every evening   baclofen (LIORESAL) 10 mg, Oral, 3 times daily PRN   Carboxymethylcellul-Glycerin (LUBRICATING EYE DROPS OP) 1 drop, Both Eyes, Daily PRN   Dialyvite Vitamin D 5000 5,000 Units, Oral, Every evening   docusate sodium (COLACE) 100 mg, Oral, 2 times daily, To prevent constipation while taking pain medication.   Krill Oil 500 mg, Oral, Every evening   Melatonin 10 mg, Oral, Daily at bedtime   Nutritional Supplements (VITAMIN D BOOSTER PO) 2,000 mg, Oral   omeprazole (PRILOSEC) 20 mg, Oral, Daily, 30 days for gastroprotection while taking NSAIDs.   ondansetron (ZOFRAN) 4 mg, Oral, Every 8 hours PRN   Testosterone 25 LK/4.4WN (1%) GEL 1 application., Transdermal, Daily   valsartan-hydrochlorothiazide (DIOVAN-HCT) 320-25 MG tablet 1 tablet, Oral, Every evening     VITALS:   Vitals:   01/18/22 0752  BP: (!) 158/81  Pulse: 72  Resp: 18  SpO2: 99%  Weight: 186 lb (84.4 kg)  Height: '5\' 9"'$  (1.753 m)       View : No data to display.          PHYSICAL EXAM   HEENT:  Normocephalic, atraumatic. The mucous membranes are moist. The superficial temporal arteries are without ropiness or tenderness. Cardiovascular: Regular rate and rhythm. Lungs: Clear to auscultation bilaterally. Neck: There are no carotid bruits noted bilaterally.  NEUROLOGICAL:    01/18/2022    8:00 AM  Montreal Cognitive Assessment   Visuospatial/ Executive (0/5) 4  Naming (0/3) 2  Attention: Read list of digits (0/2) 2  Attention: Read list of letters (0/1) 1  Attention: Serial 7 subtraction starting at 100 (0/3) 3  Language: Repeat phrase (0/2) 2  Language : Fluency (0/1) 0   Abstraction (0/2) 2  Delayed Recall (0/5) 0  Orientation (0/6) 5  Total 21        View : No data to display.           Orientation:  Alert and oriented to person, place and time. No aphasia or dysarthria. Fund of knowledge is appropriate. Recent memory impaired and remote memory intact. Attention and concentration are normal.  Able to name objects and repeat phrases. Delayed recall   0/5 Cranial nerves: There is good facial symmetry. Extraocular muscles are intact and visual fields are full to confrontational testing. Speech  is fluent and clear. Soft palate rises symmetrically and there is no tongue deviation. Hearing is intact to conversational tone. Tone: Tone is good throughout. Sensation: Sensation is intact to light touch and pinprick throughout. Vibration is intact at the bilateral big toe.There is no extinction with double simultaneous stimulation. There is no sensory dermatomal level identified. Coordination: The patient has no difficulty with RAM's or FNF bilaterally. Normal finger to nose  Motor: Strength is 5/5 in the bilateral upper and lower extremities. There is no pronator drift. There are no fasciculations noted. DTR's: Deep tendon reflexes are 2/4 at the bilateral biceps, triceps, brachioradialis, patella and achilles.  Plantar responses are downgoing bilaterally. Gait and Station: The patient is able to ambulate without difficulty.The patient is able to heel toe walk without any difficulty.The patient is able to ambulate in a tandem fashion. The patient is able to stand in the Romberg position. Thank you for allowing [place] the opportunity to participate in the care of this nice patient. Please do not hesitate to contact us for any questions or concerns.   Total time spent on today's visit was 60 minutes dedicated to this patient today, preparing to see patient, examining the patient, ordering tests and/or medications and counseling the patient, documenting clinical information  in the EHR or other health record, independently interpreting results and communicating results to the patient/family, discussing treatment and goals, answering patient's questions and coordinating care.  Cc:  London Pepper, MD  Sharene Butters 01/18/2022 1:59 PM

## 2022-01-31 DIAGNOSIS — E785 Hyperlipidemia, unspecified: Secondary | ICD-10-CM | POA: Diagnosis not present

## 2022-01-31 DIAGNOSIS — F488 Other specified nonpsychotic mental disorders: Secondary | ICD-10-CM | POA: Diagnosis not present

## 2022-01-31 DIAGNOSIS — R7309 Other abnormal glucose: Secondary | ICD-10-CM | POA: Diagnosis not present

## 2022-01-31 DIAGNOSIS — I10 Essential (primary) hypertension: Secondary | ICD-10-CM | POA: Diagnosis not present

## 2022-02-01 DIAGNOSIS — J209 Acute bronchitis, unspecified: Secondary | ICD-10-CM | POA: Diagnosis not present

## 2022-02-07 ENCOUNTER — Ambulatory Visit
Admission: RE | Admit: 2022-02-07 | Discharge: 2022-02-07 | Disposition: A | Discharge status: Home / Self Care | Payer: Medicare PPO | Source: Ambulatory Visit | Attending: Physician Assistant | Admitting: Physician Assistant

## 2022-02-07 DIAGNOSIS — R413 Other amnesia: Secondary | ICD-10-CM | POA: Diagnosis not present

## 2022-02-22 ENCOUNTER — Encounter: Payer: Self-pay | Admitting: Physician Assistant

## 2022-02-22 ENCOUNTER — Ambulatory Visit: Payer: Medicare PPO | Admitting: Physician Assistant

## 2022-02-22 VITALS — BP 147/87 | HR 65 | Resp 18 | Ht 69.0 in | Wt 185.0 lb

## 2022-02-22 DIAGNOSIS — G3184 Mild cognitive impairment, so stated: Secondary | ICD-10-CM

## 2022-02-22 DIAGNOSIS — R413 Other amnesia: Secondary | ICD-10-CM | POA: Diagnosis not present

## 2022-02-22 MED ORDER — MEMANTINE HCL 5 MG PO TABS: 5.0000 mg | ORAL_TABLET | Freq: Two times a day (BID) | ORAL | 11 refills | Status: DC

## 2022-02-22 MED ORDER — MEMANTINE HCL 5 MG PO TABS: 5.0000 mg | ORAL_TABLET | Freq: Every day | ORAL | 11 refills | Status: DC

## 2022-02-22 NOTE — Patient Instructions (Signed)
It was a pleasure to see you today at our office.   Recommendations:  Start memantine 5 mg  Take 1 tablet (5 mg at night)   Neurocognitive testing  Recommend CBT Follow up in 6 months  Whom to call:  Memory  decline, memory medications: Call out office [phone]   For psychiatric meds, mood meds: Please have your primary care physician manage these medications.    Feel free to go to the following database for funded clinical studies conducted around the world: http://saunders.com/   https://www.triadclinicaltrials.com/     RECOMMENDATIONS FOR ALL PATIENTS WITH MEMORY PROBLEMS: 1. Continue to exercise (Recommend 30 minutes of walking everyday, or 3 hours every week) 2. Increase social interactions - continue going to [place] and enjoy social gatherings with friends and family 3. Eat healthy, avoid fried foods and eat more fruits and vegetables 4. Maintain adequate blood pressure, blood sugar, and blood cholesterol level. Reducing the risk of stroke and cardiovascular disease also helps promoting better memory. 5. Avoid stressful situations. Live a simple life and avoid aggravations. Organize your time and prepare for the next day in anticipation. 6. Sleep well, avoid any interruptions of sleep and avoid any distractions in the bedroom that may interfere with adequate sleep quality 7. Avoid sugar, avoid sweets as there is a strong link between excessive sugar intake, diabetes, and cognitive impairment We discussed the Mediterranean diet, which has been shown to help patients reduce the risk of progressive memory disorders and reduces cardiovascular risk. This includes eating fish, eat fruits and green leafy vegetables, nuts like almonds and hazelnuts, walnuts, and also use olive oil. Avoid fast foods and fried foods as much as possible. Avoid sweets and sugar as sugar use has been linked to worsening of memory function.  There is always a concern of gradual progression of  memory problems. If this is the case, then we may need to adjust level of care according to patient needs. Support, both to the patient and caregiver, should then be put into place.      You have been referred for a neuropsychological evaluation (i.e., evaluation of memory and thinking abilities). Please bring someone with you to this appointment if possible, as it is helpful for the doctor to hear from both you and another adult who knows you well. Please bring eyeglasses and hearing aids if you wear them.    The evaluation will take approximately 3 hours and has two parts:   The first part is a clinical interview with the neuropsychologist (Dr. Melvyn Novas or Dr. Nicole Kindred). During the interview, the neuropsychologist will speak with you and the individual you brought to the appointment.    The second part of the evaluation is testing with the doctor's technician Hinton Dyer or Maudie Mercury). During the testing, the technician will ask you to remember different types of material, solve problems, and answer some questionnaires. Your family member will not be present for this portion of the evaluation.   Please note: We must reserve several hours of the neuropsychologist's time and the psychometrician's time for your evaluation appointment. As such, there is a No-Show fee of $100. If you are unable to attend any of your appointments, please contact our office as soon as possible to reschedule.    FALL PRECAUTIONS: Be cautious when walking. Scan the area for obstacles that may increase the risk of trips and falls. When getting up in the mornings, sit up at the edge of the bed for a few minutes before getting out of  bed. Consider elevating the bed at the head end to avoid drop of blood pressure when getting up. Walk always in a well-lit room (use night lights in the walls). Avoid area rugs or power cords from appliances in the middle of the walkways. Use a walker or a cane if necessary and consider physical therapy for  balance exercise. Get your eyesight checked regularly.  FINANCIAL OVERSIGHT: Supervision, especially oversight when making financial decisions or transactions is also recommended.  HOME SAFETY: Consider the safety of the kitchen when operating appliances like stoves, microwave oven, and blender. Consider having supervision and share cooking responsibilities until no longer able to participate in those. Accidents with firearms and other hazards in the house should be identified and addressed as well.   ABILITY TO BE LEFT ALONE: If patient is unable to contact 911 operator, consider using LifeLine, or when the need is there, arrange for someone to stay with patients. Smoking is a fire hazard, consider supervision or cessation. Risk of wandering should be assessed by caregiver and if detected at any point, supervision and safe proof recommendations should be instituted.  MEDICATION SUPERVISION: Inability to self-administer medication needs to be constantly addressed. Implement a mechanism to ensure safe administration of the medications.   DRIVING: Regarding driving, in patients with progressive memory problems, driving will be impaired. We advise to have someone else do the driving if trouble finding directions or if minor accidents are reported. Independent driving assessment is available to determine safety of driving.   If you are interested in the driving assessment, you can contact the following:  [employer] in [place]  [place] [phone] or [phone]    [place] refers to food and lifestyle choices that are based on the traditions of countries located on the [employer]. This way of eating has been shown to help prevent certain conditions and improve outcomes for people who have chronic diseases, like kidney disease and heart  disease. What are tips for following this plan? Lifestyle  Cook and eat meals together with your family, when possible. Drink enough fluid to keep your urine clear or pale yellow. Be physically active every day. This includes: Aerobic exercise like running or swimming. Leisure activities like gardening, walking, or housework. Get 7-8 hours of sleep each night. If recommended by your health care provider, drink red wine in moderation. This means 1 glass a day for nonpregnant women and 2 glasses a day for men. A glass of wine equals 5 oz (150 mL). Reading food labels  Check the serving size of packaged foods. For foods such as rice and pasta, the serving size refers to the amount of cooked product, not dry. Check the total fat in packaged foods. Avoid foods that have saturated fat or trans fats. Check the ingredients list for added sugars, such as corn syrup. Shopping  At the grocery store, buy most of your food from the areas near the walls of the store. This includes: Fresh fruits and vegetables (produce). Grains, beans, nuts, and seeds. Some of these may be available in unpackaged forms or large amounts (in bulk). Fresh seafood. Poultry and eggs. Low-fat dairy products. Buy whole ingredients instead of prepackaged foods. Buy fresh fruits and vegetables in-season from local farmers markets. Buy frozen fruits and vegetables in resealable bags. If you do not have access to quality fresh seafood, buy precooked frozen shrimp or canned fish, such as  tuna, salmon, or sardines. Buy small amounts of raw or cooked vegetables, salads, or olives from the deli or salad bar at your store. Stock your pantry so you always have certain foods on hand, such as olive oil, canned tuna, canned tomatoes, rice, pasta, and beans. Cooking  Cook foods with extra-virgin olive oil instead of using butter or other vegetable oils. Have meat as a side dish, and have vegetables or grains as your main dish. This means  having meat in small portions or adding small amounts of meat to foods like pasta or stew. Use beans or vegetables instead of meat in common dishes like chili or lasagna. Experiment with different cooking methods. Try roasting or broiling vegetables instead of steaming or sauteing them. Add frozen vegetables to soups, stews, pasta, or rice. Add nuts or seeds for added healthy fat at each meal. You can add these to yogurt, salads, or vegetable dishes. Marinate fish or vegetables using olive oil, lemon juice, garlic, and fresh herbs. Meal planning  Plan to eat 1 vegetarian meal one day each week. Try to work up to 2 vegetarian meals, if possible. Eat seafood 2 or more times a week. Have healthy snacks readily available, such as: Vegetable sticks with hummus. Greek yogurt. Fruit and nut trail mix. Eat balanced meals throughout the week. This includes: Fruit: 2-3 servings a day Vegetables: 4-5 servings a day Low-fat dairy: 2 servings a day Fish, poultry, or lean meat: 1 serving a day Beans and legumes: 2 or more servings a week Nuts and seeds: 1-2 servings a day Whole grains: 6-8 servings a day Extra-virgin olive oil: 3-4 servings a day Limit red meat and sweets to only a few servings a month What are my food choices? Mediterranean diet Recommended Grains: Whole-grain pasta. Brown rice. Bulgar wheat. Polenta. Couscous. Whole-wheat bread. [place]. Vegetables: Artichokes. Beets. Broccoli. Cabbage. Carrots. Eggplant. Green beans. Chard. Kale. Spinach. Onions. Leeks. Peas. Squash. Tomatoes. Peppers. Radishes. Fruits: Apples. Apricots. Avocado. Berries. Bananas. Cherries. Dates. Figs. Grapes. Lemons. Melon. Oranges. Peaches. Plums. Pomegranate. Meats and other protein foods: Beans. Almonds. Sunflower seeds. Pine nuts. Peanuts. [place]. Salmon. Scallops. Shrimp. [place]. Tilapia. Clams. Oysters. Eggs. Dairy: Low-fat milk. Cheese. Greek yogurt. Beverages: Water. Red wine. Herbal tea. Fats and  oils: Extra virgin olive oil. Avocado oil. Grape seed oil. Sweets and desserts: [place] yogurt with honey. Baked apples. Poached pears. Trail mix. Seasoning and other foods: Basil. Cilantro. Coriander. Cumin. Mint. Parsley. Sage. Rosemary. Tarragon. Garlic. Oregano. Thyme. Pepper. Balsalmic vinegar. Tahini. Hummus. Tomato sauce. Olives. Mushrooms. Limit these Grains: Prepackaged pasta or rice dishes. Prepackaged cereal with added sugar. Vegetables: Deep fried potatoes (french fries). Fruits: Fruit canned in syrup. Meats and other protein foods: Beef. Pork. Lamb. Poultry with skin. Hot dogs. [place] Salines. Dairy: Ice cream. Sour cream. Whole milk. Beverages: Juice. Sugar-sweetened soft drinks. Beer. Liquor and spirits. Fats and oils: Butter. Canola oil. Vegetable oil. Beef fat (tallow). Lard. Sweets and desserts: Cookies. Cakes. Pies. Candy. Seasoning and other foods: Mayonnaise. Premade sauces and marinades. The items listed may not be a complete list. Talk with your dietitian about what dietary choices are right for you. Summary The Mediterranean diet includes both food and lifestyle choices. Eat a variety of fresh fruits and vegetables, beans, nuts, seeds, and whole grains. Limit the amount of red meat and sweets that you eat. Talk with your health care provider about whether it is safe for you to drink red wine in moderation. This means 1 glass a day for nonpregnant women  and 2 glasses a day for men. A glass of wine equals 5 oz (150 mL). This information is not intended to replace advice given to you by your health care provider. Make sure you discuss any questions you have with your health care provider. Document Released: 04/13/2016 Document Revised: 05/16/2016 Document Reviewed: 04/13/2016 Elsevier Interactive Patient Education  2017 [employer].   We have sent a referral to [facility] for your MRI and they will call you directly to schedule your appointment. They are located at [place]. If you need to contact them directly please call [phone].

## 2022-02-22 NOTE — Progress Notes (Signed)
Assessment/Plan:   Mild Cognitive Impairment   Richard Harmon is a very pleasant 71 y.o. year old RH male with  a history of hypertension, hyperlipidemia, decreased hearing, hyperglycemia, low testosterone on Androgel, psoriasis, seen today for evaluation of memory loss.  Last MoCA on 01/18/2022 was 21/30.  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  He is also taking B12 supplements, which have improved mildly his cognitive status.  MRI of the brain shows mild parenchymal volume loss, early chronic microangiopathy, but no acute findings.   Recommendations:  Recommend neurocognitive testing for clarity of the diagnosis and disease trajectory. Start memantine 5 mg nightly, side effects discussed Recommend good control of cardiovascular risk factors Continue aspirin daily Follow-up in 6 months.  Case discussed with Dr. Delice Lesch who agrees with the plan    Subjective:    Richard Harmon is a very pleasant 71 y.o. RH male  seen today in follow up for memory loss. This patient is accompanied in the office by his wife who supplements the history.  Previous records as well as any outside records available were reviewed prior to todays visit.  Patient was last seen at our office on 01/18/2022 at which time his MoCA was 21/30.  He is not on antidementia medication   Any changes in memory since last visit?  He feels that he has "less foggy brain", reporting that "COVID is getting out of my system".  He also began taking B12 supplementation, which he reports is helping him from the cognitive status.   Patient lives with: Spouse.  She reports that he does repeat himself, and he has issues with short-term memory.  "He might not realize this, but I do notice this for quite some time".  Disoriented when walking into a room?  Patient denies   Leaving objects in unusual places?  Patient denies   Ambulates  with difficulty?   Patient  denies   Recent falls?  Patient denies   Any head injuries?  Patient denies   History of seizures?   Patient denies   Wandering behavior?  Patient denies   Patient drives?   Endorsed, he denies any issues with driving such as becoming lost. Any mood changes such irritability agitation?  Patient denies   Any history of depression?:  Patient denies   Hallucinations?  Patient denies   Paranoia?  Patient denies   Patient reports that he sleeps well without vivid dreams, REM behavior or sleepwalking  History of sleep apnea?  Patient denies   Any hygiene concerns?  Patient denies   Independent of bathing and dressing?  Endorsed  Does the patient needs help with medications?  He denies.  He is in charge of his medications Who is in charge of the finances?  Patient is in charge  Any changes in appetite?  Patient denies   Patient have trouble swallowing? Patient denies   Does the patient cook?  Patient denies   Any kitchen accidents such as leaving the stove on? Patient denies   Any headaches?  Patient denies   The double vision? Patient denies   Any focal numbness or tingling?  Patient denies   Chronic back pain Patient denies   Unilateral weakness?  Patient denies   Any tremors?  Patient denies   Any history of anosmia?  Patient denies   Any incontinence of urine?  Patient denies   Any bowel dysfunction?   Patient denies  MRI of the brain 01/18/2022 minimal amount of nonspecific T2 hyperintense lesions of the white matter, may represent early chronic microangiopathy. 2. Mild parenchymal volume loss.   Past Medical History:  Diagnosis Date   Arthritis    Bronchitis 2018   Cancer ([place]) 07/04/2018   Basal Cell    Hypertension    Pneumonia      Past Surgical History:  Procedure Laterality Date   BASAL CELL CARCINOMA EXCISION  06/2017   BICEPS TENDON REPAIR  07/05/2018   CATARACT EXTRACTION Left 10/05/2009   HERNIA REPAIR  2007   TONSILLECTOMY     TOTAL KNEE ARTHROPLASTY Left  03/04/2019   Procedure: TOTAL KNEE ARTHROPLASTY;  Surgeon: Renette Butters, MD;  Location: [facility];  Service: Orthopedics;  Laterality: Left;     PREVIOUS MEDICATIONS:   CURRENT MEDICATIONS:  Outpatient Encounter Medications as of 02/22/2022  Medication Sig   amLODipine (NORVASC) 5 MG tablet Take 5 mg by mouth every evening.   atorvastatin (LIPITOR) 40 MG tablet Take 40 mg by mouth every evening.   Carboxymethylcellul-Glycerin (LUBRICATING EYE DROPS OP) Place 1 drop into both eyes daily as needed (irritation).   Krill Oil 500 MG CAPS Take 500 mg by mouth every evening.   Melatonin 10 MG CAPS Take 10 mg by mouth at bedtime.   Nutritional Supplements (VITAMIN D BOOSTER PO) Take 2,000 mg by mouth.   Testosterone 25 MG/2.5GM (1%) GEL Place 1 application onto the skin daily.   valsartan-hydrochlorothiazide (DIOVAN-HCT) 320-25 MG tablet Take 1 tablet by mouth every evening.   aspirin EC 81 MG tablet Take 1 tablet (81 mg total) by mouth 2 (two) times daily. For DVT prophylaxis for 30 days after surgery.   baclofen (LIORESAL) 10 MG tablet Take 1 tablet (10 mg total) by mouth 3 (three) times daily as needed for muscle spasms.   Cholecalciferol (DIALYVITE VITAMIN D 5000) 125 MCG (5000 UT) capsule Take 5,000 Units by mouth every evening.   docusate sodium (COLACE) 100 MG capsule Take 1 capsule (100 mg total) by mouth 2 (two) times daily. To prevent constipation while taking pain medication.   omeprazole (PRILOSEC) 20 MG capsule Take 1 capsule (20 mg total) by mouth daily. 30 days for gastroprotection while taking NSAIDs.   ondansetron (ZOFRAN) 4 MG tablet Take 1 tablet (4 mg total) by mouth every 8 (eight) hours as needed for nausea or vomiting.   No facility-administered encounter medications on file as of 02/22/2022.     Objective:     PHYSICAL EXAMINATION:    VITALS:   Vitals:   02/22/22 0837  BP: (!) 147/87  Pulse: 65  Resp: 18  SpO2: 95%  Weight: 185 lb (83.9 kg)  Height: '5\' 9"'$   (1.753 m)    GEN:  The patient appears stated age and is in NAD. HEENT:  Normocephalic, atraumatic.   Neurological examination:  General: NAD, well-groomed, appears stated age. Orientation: The patient is alert. Oriented to person, place and date Cranial nerves: There is good facial symmetry.The speech is fluent and clear. No aphasia or dysarthria. Fund of knowledge is appropriate. Recent memory impaired and remote memory is normal.  Attention and concentration are normal.  Able to name objects and repeat phrases.  Hearing is intact to conversational tone.    Sensation: Sensation is intact to light touch throughout Motor: Strength is at least antigravity x4. Tremors: none  DTR's 2/4 in UE/LE      01/18/2022    8:00 AM  Montreal Cognitive Assessment  Visuospatial/ Executive (0/5) 4  Naming (0/3) 2  Attention: Read list of digits (0/2) 2  Attention: Read list of letters (0/1) 1  Attention: Serial 7 subtraction starting at 100 (0/3) 3  Language: Repeat phrase (0/2) 2  Language : Fluency (0/1) 0  Abstraction (0/2) 2  Delayed Recall (0/5) 0  Orientation (0/6) 5  Total 21        No data to display             Movement examination: Tone: There is normal tone in the UE/LE Abnormal movements:  no tremor.  No myoclonus.  No asterixis.   Coordination:  There is no decremation with RAM's. Normal finger to nose  Gait and Station: The patient has no difficulty arising out of a deep-seated chair without the use of the hands. The patient's stride length is good.  Gait is cautious and narrow.   Thank you for allowing [place] the opportunity to participate in the care of this nice patient. Please do not hesitate to contact us for any questions or concerns.   Total time spent on today's visit was 47 minutes dedicated to this patient today, preparing to see patient, examining the patient, ordering tests and/or medications and counseling the patient, documenting clinical information in the EHR  or other health record, independently interpreting results and communicating results to the patient/family, discussing treatment and goals, answering patient's questions and coordinating care.  Cc:  London Pepper, MD  Sharene Butters 02/22/2022 8:43 AM   Cc:  London Pepper, MD Sharene Butters, PA-C

## 2022-03-16 ENCOUNTER — Other Ambulatory Visit: Payer: Self-pay | Admitting: Physician Assistant

## 2022-03-28 DIAGNOSIS — H5202 Hypermetropia, left eye: Secondary | ICD-10-CM | POA: Diagnosis not present

## 2022-04-17 DIAGNOSIS — E291 Testicular hypofunction: Secondary | ICD-10-CM | POA: Diagnosis not present

## 2022-04-17 DIAGNOSIS — R948 Abnormal results of function studies of other organs and systems: Secondary | ICD-10-CM | POA: Diagnosis not present

## 2022-05-15 DIAGNOSIS — Z01110 Encounter for hearing examination following failed hearing screening: Secondary | ICD-10-CM | POA: Diagnosis not present

## 2022-06-12 DIAGNOSIS — L814 Other melanin hyperpigmentation: Secondary | ICD-10-CM | POA: Diagnosis not present

## 2022-06-12 DIAGNOSIS — Z08 Encounter for follow-up examination after completed treatment for malignant neoplasm: Secondary | ICD-10-CM | POA: Diagnosis not present

## 2022-06-12 DIAGNOSIS — Z85828 Personal history of other malignant neoplasm of skin: Secondary | ICD-10-CM | POA: Diagnosis not present

## 2022-06-12 DIAGNOSIS — L570 Actinic keratosis: Secondary | ICD-10-CM | POA: Diagnosis not present

## 2022-06-12 DIAGNOSIS — D225 Melanocytic nevi of trunk: Secondary | ICD-10-CM | POA: Diagnosis not present

## 2022-06-12 DIAGNOSIS — Z872 Personal history of diseases of the skin and subcutaneous tissue: Secondary | ICD-10-CM | POA: Diagnosis not present

## 2022-06-12 DIAGNOSIS — L821 Other seborrheic keratosis: Secondary | ICD-10-CM | POA: Diagnosis not present

## 2022-07-10 ENCOUNTER — Encounter: Payer: Self-pay | Admitting: Psychology

## 2022-07-11 ENCOUNTER — Ambulatory Visit: Payer: Medicare PPO

## 2022-07-11 ENCOUNTER — Encounter: Payer: Self-pay | Admitting: Psychology

## 2022-07-11 ENCOUNTER — Ambulatory Visit: Payer: Medicare PPO | Admitting: Psychology

## 2022-07-11 DIAGNOSIS — Z8616 Personal history of COVID-19: Secondary | ICD-10-CM

## 2022-07-11 DIAGNOSIS — G3184 Mild cognitive impairment, so stated: Secondary | ICD-10-CM

## 2022-07-11 DIAGNOSIS — F0670 Mild neurocognitive disorder due to known physiological condition without behavioral disturbance: Secondary | ICD-10-CM | POA: Insufficient documentation

## 2022-07-11 DIAGNOSIS — R4189 Other symptoms and signs involving cognitive functions and awareness: Secondary | ICD-10-CM

## 2022-07-11 HISTORY — DX: Mild neurocognitive disorder due to known physiological condition without behavioral disturbance: F06.70

## 2022-07-11 NOTE — Progress Notes (Addendum)
NEUROPSYCHOLOGICAL EVALUATION [facility] Department of Neurology  Date of Evaluation: July 11, 2022  Reason for Referral:   Richard Harmon is a 71 y.o. right-handed Caucasian male referred by Sharene Butters, PA-C, to characterize his current cognitive functioning and assist with diagnostic clarity and treatment planning in the context of subjective cognitive decline.   Assessment and Plan:   Clinical Impression(s): Richard Harmon pattern of performance is suggestive of primary impairments across verbal fluency (semantic worse than phonemic) and verbal learning and memory. Additional deficits were exhibited across executive functioning, particularly cognitive flexibility and abstract reasoning. Performance variability was exhibited across processing speed. Performances were appropriate relative to age-matched peers across attention/concentration, safety/judgment, receptive language, confrontation naming, visuospatial abilities, and visual learning and memory. Richard Harmon denied difficulties completing instrumental activities of daily living (ADLs) independently. As such, given evidence for cognitive dysfunction described above, he meets criteria for a Mild Neurocognitive Disorder ("mild cognitive impairment") at the present time.  Regarding etiology, there has yet to be a sufficient number of longitudinal studies to elucidate the likelihood and extent of persisting cognitive dysfunction stemming from prior COVID-19 infection. Current studies have theorized many different potential contributing factors and it remains unclear if deficits are due to direct CNS involvement, related to external factors (e.g., fatigue, sleep disruption, and psychiatric distress), or related to a combination of several factors. Richard Harmon reported the emergence of cognitive dysfunction following his bout with COVID-19 and denied the presence of any difficulties prior to this (there is no  testing available to corroborate this report). There remains the possibility that his COVID-19 history could be influencing ongoing cognitive functioning. Areas commonly reported to be affected would include processing speed, executive functioning, verbal fluency, and certain aspects of memory.   Across verbal memory tasks, there was some evidence to suggest rapid forgetting and an evolving storage impairment. Retention rates of 0% (list learning task), 24% (daily living task) and 78% (story learning task) suggest ongoing variability but also overall weakness. Rapid forgetting and storage dysfunction can be an early sign of Alzheimer's disease. Impaired semantic fluency is also seen early on in this illness, as would reports by his wife via medical records that Richard Harmon is repetitive in conversation and may not have proper insight into memory impairment. However, it is important to highlight that visual memory was strong, as was confrontation naming. While the very early stages of this illness cannot be ruled out, they also cannot be ruled in based upon the current evaluation. Continued monitoring over time will be very important.   Richard Harmon did not report any behavioral features concerning for Lewy body disease, Parkinson's disease, frontotemporal lobar degeneration, primary progressive aphasia, or more rare parkinsonian conditions. Testing patterns also do not strongly align with these etiologies more so than what has been described above. Recent neuroimaging revealed minimal microvascular ischemic disease, making a ongoing cerebrovascular contribution minimal at best. He also did not report other psychosocial factors known to affect cognitive abilities, including psychiatric distress, chronic pain, headache symptoms, or sleep dysfunction.   Recommendations: A repeat neuropsychological evaluation in 12-18 months (or sooner if functional decline is noted) is recommended to assess the trajectory of  future cognitive decline should it occur. This will also aid in future efforts towards improved diagnostic clarity.  Richard Harmon has already been prescribed a medication aimed to address memory loss and concerns surrounding cognitive decline (i.e., memantine/Namenda). He is encouraged to continue taking this medication as prescribed. It is  important to highlight that this medication has been shown to slow functional decline in some individuals. There is no current treatment which can stop or reverse cognitive decline when caused by a neurodegenerative illness.   Richard Harmon is encouraged to attend to lifestyle factors for brain health (e.g., regular physical exercise, good nutrition habits, regular participation in cognitively-stimulating activities, and general stress management techniques), which are likely to have benefits for both emotional adjustment and cognition. Optimal control of vascular risk factors (including safe cardiovascular exercise and adherence to dietary recommendations) is encouraged. Continued participation in activities which provide mental stimulation and social interaction is also recommended.   When learning new information, he would benefit from information being broken up into small, manageable pieces. He may also find it helpful to articulate the material in his own words and in a context to promote encoding at the onset of a new task. This material may need to be repeated multiple times to promote encoding.  Memory can be improved using internal strategies such as rehearsal, repetition, chunking, mnemonics, association, and imagery. External strategies such as written notes in a consistently used memory journal, visual and nonverbal auditory cues such as a calendar on the refrigerator or appointments with alarm, such as on a cell phone, can also help maximize recall.    To address problems with processing speed, he may wish to consider:   -Ensuring that he is alerted when  essential material or instructions are being presented   -Adjusting the speed at which new information is presented   -Allowing for more time in comprehending, processing, and responding in conversation  To address problems with executive dysfunction, he may wish to consider:   -Avoiding external distractions when needing to concentrate   -Limiting exposure to fast paced environments with multiple sensory demands   -Writing down complicated information and using checklists   -Attempting and completing one task at a time (i.e., no multi-tasking)   -Verbalizing aloud each step of a task to maintain focus   -Reducing the amount of information considered at one time  Review of Records:   Richard Harmon was seen by [facility] Sharene Butters, PA-C) on 01/18/2022 for an evaluation of memory loss. Briefly, Richard Harmon reported contracting COVID-19 in the summer/fall of 2022. There was no reported hospitalization, low O2 values, or required ventilation or oxygen support. He reported persisting symptoms surrounding brain fog and diminished memory after his recovery. At the time he met with Ms. Wertman, he reported the full amelioration of his symptoms. He also noted starting B12 supplementation which was said to be beneficial. However, performance on a brief cognitive screening instrument (MOCA) was 21/30, including a 0/5 performance across delayed recall. Ultimately, Richard Harmon was referred for a comprehensive neuropsychological evaluation to characterize his cognitive abilities and to assist with diagnostic clarity and treatment planning.   Brain MRI on 02/08/2022 revealed mild parenchymal volume loss and minimal microvascular ischemic changes.   Richard Harmon followed-up with Ms. Wertman on 02/22/2022. Symptoms were said to be stable. 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   Testosterone deficiency in male 02/11/2019    Past Surgical History:  Procedure Laterality Date   BASAL CELL CARCINOMA EXCISION  06/2017   BICEPS TENDON REPAIR  07/05/2018   CATARACT EXTRACTION Left 10/05/2009   HERNIA REPAIR  2007   TONSILLECTOMY     TOTAL KNEE ARTHROPLASTY Left 03/04/2019   Procedure: TOTAL KNEE ARTHROPLASTY;  Surgeon: Renette Butters, MD;  Location: [facility];  Service: Orthopedics;  Laterality: Left;    Current Outpatient Medications:    amLODipine (NORVASC) 5 MG tablet, Take 5 mg by mouth every evening., Disp: , Rfl:    aspirin EC 81 MG tablet, Take 1 tablet (81 mg total) by mouth 2 (two) times daily. For DVT prophylaxis for 30 days after surgery., Disp: 60 tablet, Rfl: 0   atorvastatin (LIPITOR) 40 MG tablet, Take 40 mg by mouth every evening., Disp: , Rfl:    Cholecalciferol (DIALYVITE VITAMIN D 5000) 125 MCG (5000 UT) capsule, Take 5,000 Units by mouth every evening., Disp: , Rfl:    Krill Oil 500 MG CAPS, Take 500 mg by mouth every evening., Disp: , Rfl:    Melatonin 10 MG CAPS, Take 10 mg by mouth at bedtime., Disp: , Rfl:    memantine (NAMENDA) 5 MG tablet, TAKE 1 TABLET (5 MG TOTAL) BY MOUTH DAILY., Disp: 90 tablet, Rfl: 2   Nutritional Supplements (VITAMIN D BOOSTER PO), Take 2,000 mg by mouth., Disp: , Rfl:    Testosterone 25 MG/2.5GM (1%) GEL, Place 1 application onto the skin daily., Disp: , Rfl:    valsartan-hydrochlorothiazide (DIOVAN-HCT) 320-25 MG tablet, Take 1 tablet by mouth every evening., Disp: , Rfl:   Clinical Interview:   The following information was  obtained during a clinical interview with Richard Harmon prior to cognitive testing.  Cognitive Symptoms: Decreased short-term memory: Denied. Decreased long-term memory: Denied. Decreased attention/concentration: Denied. Reduced processing speed: Denied. Difficulties with executive functions: Denied. He also denied any impulsivity or significant personality changes.  Difficulties with emotion regulation: Denied. Difficulties with receptive language: Denied. Difficulties with word finding: Denied. Decreased visuoperceptual ability: Denied.  Trajectory of deficits: He reported contracting COVID-19 in August 2022. No difficulties were said to be present prior to this event. After that time, he reported persisting symptoms stemming from this illness, particularly focusing on reduced processing speed, brain fog, and short-term memory dysfunction. He noted that these symptoms have fully subsided, stating that even his wife says that he "seems normal now." His wife was not present to corroborate this statement.   Difficulties completing ADLs: Denied.  Additional Medical History: History of traumatic brain injury/concussion: Denied. History of stroke: Denied. History of seizure activity: Denied. History of known exposure to toxins: Denied. Symptoms of chronic pain: Denied. Experience of frequent headaches/migraines: Denied. Frequent instances of dizziness/vertigo: Denied.  Sensory changes: He reported ongoing hearing loss and utilizes hearing aids with benefit. Other sensory changes/difficulties (e.g., vision, taste, smell) were denied. Balance/coordination difficulties: Denied. He also denied any recent falls.  Other motor difficulties: Denied.  Sleep History: Estimated hours obtained each night: 7-8 hours.  Difficulties falling asleep: Denied. Difficulties staying asleep: Denied outside of waking to use the restroom.  Feels rested and refreshed upon awakening: Endorsed.  History of  snoring: Endorsed "sometimes." History of waking up gasping for air: Denied. Witnessed breath cessation while asleep: Denied.  History of vivid dreaming: Denied. Excessive movement while asleep:  Denied. Instances of acting out his dreams: Denied.  Psychiatric/Behavioral Health History: Depression: Denied. He described his current mood as "okay" and denied any previous mental health concerns or diagnoses. Current or remote suicidal ideation, intent, or plan was denied.  Anxiety: Denied. However, he did describe some acute anxiety surrounding COVID-19 and symptoms which have persisted beyond his recovery.  Mania: Denied. Trauma History: Denied. Visual/auditory hallucinations: Denied. Delusional thoughts: Denied.  Tobacco: Denied. Alcohol: He denied current alcohol consumption as well as a history of problematic alcohol abuse or dependence.  Recreational drugs: Denied.  Family History: Problem Relation Age of Onset   Mental illness Mother        "mental breakdown"   This information was confirmed by Mr. Lembcke.  Academic/Vocational History: Highest level of educational attainment: 18 years. He earned an Loss adjuster, chartered and described himself as a good (A/B) student in academic settings. No relative weaknesses were identified.  History of developmental delay: Denied. History of grade repetition: Denied. Enrollment in special education courses: Denied. History of LD/ADHD: Denied.  Employment: Retired. He previously worked in the [employer]. 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 He reported no difficulties performing the requirements of this position.   Evaluation Results:   Behavioral Observations: Richard Harmon was unaccompanied, arrived to his appointment on time, and was appropriately dressed and groomed. He appeared alert and oriented. Observed gait and station were within normal limits. Gross motor functioning appeared intact  upon informal observation and no abnormal movements (e.g., tremors) were noted. His affect was generally relaxed and positive. Spontaneous speech was fluent and word finding difficulties were not observed during the clinical interview. Thought processes were coherent, organized, and normal in content. Insight into his cognitive difficulties appeared limited in that he denied all cognitive concerns despite objective testing revealing several areas of weakness. There remains the potential that Mr. Kennerly was simply diminishing observed concerns and attempting to portray himself in an overly favorable light.   During testing, sustained attention was appropriate. Task engagement was adequate and he persisted when challenged. Overall, Richard Harmon was cooperative with the clinical interview and subsequent testing procedures.   Adequacy of Effort: The validity of neuropsychological testing is limited by the extent to which the individual being tested may be assumed to have exerted adequate effort during testing. Richard Harmon expressed his intention to perform to the best of his abilities and exhibited adequate task engagement and persistence. Scores across stand-alone and embedded performance validity measures were within expectation. As such, the results of the current evaluation are believed to be a valid representation of Mr. Conigliaro current cognitive functioning.  Test Results: Mr. Culley was oriented at the time of the current evaluation. He was one day off when stating the current date.   Intellectual abilities based upon educational and vocational attainment were estimated to be in the average to above average range. Premorbid abilities were estimated to be within the well above average range based upon a single-word reading test.   Processing speed was variable, ranging from the well below average to above average normative ranges. Basic attention was average. More complex attention (e.g., working  memory) was above average. Executive functioning was mildly variable but largely below expectation, ranging from the exceptionally low to below average normative ranges. He did perform in the above average range across a task assessing safety and judgment.   Assessed receptive language abilities were average. Likewise, Mr. Baiz did not exhibit any difficulties  comprehending task instructions and answered all questions asked of him appropriately. Assessed expressive language was variable. Phonemic fluency was well below average to below average, semantic fluency was exceptionally low, and confrontation naming was average.    Assessed visuospatial/visuoconstructional abilities were average.    Learning (i.e., encoding) of novel information was average across a visual task but variable across verbal tasks, ranging from the well below average to average normative ranges. Spontaneous delayed recall (i.e., retrieval) of previously learned information was average across a visual task but exceptionally low to well below average across verbal tasks. Retention rates were 78% across a story learning task, 0% across a list learning task, 24% across a daily living task, and 63% across a shape learning task. Performance across recognition tasks was variable, ranging from the well below average to average normative ranges, suggesting some limited evidence for information consolidation.   Results of emotional screening instruments suggested that recent symptoms of generalized anxiety were in the minimal range, while symptoms of depression were within normal limits. A screening instrument assessing recent sleep quality suggested the presence of minimal sleep dysfunction.  Tables of Scores:   Note: This summary of test scores accompanies the interpretive report and should not be considered in isolation without reference to the appropriate sections in the text. Descriptors are based on appropriate normative data and may  be adjusted based on clinical judgment. Terms such as "Within Normal Limits" and "Outside Normal Limits" are used when a more specific description of the test score cannot be determined.       Percentile - Normative Descriptor > 98 - Exceptionally High 91-97 - Well Above Average 75-90 - Above Average 25-74 - Average 9-24 - Below Average 2-8 - Well Below Average < 2 - Exceptionally Low       Orientation:      Raw Score Percentile   NAB Orientation, Form 1 28/29 --- ---       Cognitive Screening:      Raw Score Percentile   SLUMS: 21/30 --- ---       Intellectual Functioning:      Standard Score Percentile   Barona Formula Estimated Premorbid IQ: 409 81 Above Average        Standard Score Percentile   Test of Premorbid Functioning: 121 10 Well Above Average       Memory:     NAB Memory Module, Form 1: Standard Score/ T Score Percentile   Total Memory Index 70 2 Well Below Average  List Learning       Total Trials 1-3 15/36 (35) 7 Well Below Average    List B 2/12 (34) 5 Well Below Average    Short Delay Free Recall 0/12 (19) <1 Exceptionally Low    Long Delay Free Recall 0/12 (23) <1 Exceptionally Low    Retention Percentage 0 (22) <1 Exceptionally Low    Recognition Discriminability 4 (42) 21 Below Average  Shape Learning       Total Trials 1-3 17/27 (55) 69 Average    Delayed Recall 5/9 (46) 34 Average    Retention Percentage 63 (39) 14 Below Average    Recognition Discriminability 7 (52) 58 Average  Story Learning       Immediate Recall 43/80 (34) 5 Well Below Average    Delayed Recall 18/40 (32) 4 Well Below Average    Retention Percentage 78 (44) 27 Average  Daily Living Memory       Immediate Recall 43/51 (52) 58 Average  Delayed Recall 4/17 (19) <1 Exceptionally Low    Retention Percentage 24 (<19) <1 Exceptionally Low    Recognition Hits 7/10 (36) 8 Well Below Average       Attention/Executive Function:     Trail Making Test (TMT): Raw Score (T Score)  Percentile     Part A 52 secs., 1 error (37) 9 Below Average    Part B 132 secs., 0 errors (35) 7 Well Below Average         Scaled Score Percentile   WAIS-IV Coding: 9 37 Average       NAB Attention Module, Form 1: T Score Percentile     Digits Forward 49 46 Average    Digits Backwards 58 79 Above Average        Scaled Score Percentile   WAIS-IV Similarities: 5 5 Well Below Average       D-KEFS Color-Word Interference Test: Raw Score (Scaled Score) Percentile     Color Naming 46 secs. (5) 5 Well Below Average    Word Reading 21 secs. (12) 75 Above Average    Inhibition 93 secs. (7) 16 Below Average      Total Errors 1 error (12) 75 Above Average    Inhibition/Switching 111 secs. (6) 9 Below Average      Total Errors 0 errors (13) 84 Above Average       D-KEFS Verbal Fluency Test: Raw Score (Scaled Score) Percentile     Letter Total Correct 23 (6) 9 Below Average    Category Total Correct 13 (2) <1 Exceptionally Low    Category Switching Total Correct 5 (2) <1 Exceptionally Low    Category Switching Accuracy 2 (1) <1 Exceptionally Low      Total Set Loss Errors 1 (11) 63 Average      Total Repetition Errors 1 (12) 75 Above Average       NAB Executive Functions Module, Form 1: T Score Percentile     Judgment 58 79 Above Average       Language:     Verbal Fluency Test: Raw Score (T Score) Percentile     Phonemic Fluency (FAS) 23 (32) 4 Well Below Average    Animal Fluency 8 (19) <1 Exceptionally Low        NAB Language Module, Form 1: T Score Percentile     Auditory Comprehension 56 73 Average    Naming 29/31 (43) 25 Average       Visuospatial/Visuoconstruction:      Raw Score Percentile   Clock Drawing: 10/10 --- Within Normal Limits       NAB Spatial Module, Form 1: T Score Percentile     Figure Drawing Copy 53 62 Average        Scaled Score Percentile   WAIS-IV Block Design: 8 25 Average       Mood and Personality:      Raw Score Percentile   Geriatric  Depression Scale: 0 --- Within Normal Limits  Geriatric Anxiety Scale: 6 --- Minimal    Somatic 3 --- Minimal    Cognitive 3 --- Mild    Affective 0 --- Minimal       Additional Questionnaires:      Raw Score Percentile   PROMIS Sleep Disturbance Questionnaire: 13 --- None to Slight   Informed Consent and Coding/Compliance:   The current evaluation represents a clinical evaluation for the purposes previously outlined by the referral source and is in no way reflective of a forensic evaluation.  Mr. Erman was provided with a verbal description of the nature and purpose of the present neuropsychological evaluation. Also reviewed were the foreseeable risks and/or discomforts and benefits of the procedure, limits of confidentiality, and mandatory reporting requirements of this provider. The patient was given the opportunity to ask questions and receive answers about the evaluation. Oral consent to participate was provided by the patient.   This evaluation was conducted by Christia Reading, Ph.D., ABPP-CN, board certified clinical neuropsychologist. Mr. Stemen completed a clinical interview with Dr. Melvyn Novas, billed as one unit [phone], and 125 minutes of cognitive testing and scoring, billed as one unit [phone] and three additional units [id number]. Psychometrist Cruzita Lederer, B.S., assisted Dr. Melvyn Novas with test administration and scoring procedures. As a separate and discrete service, Dr. Melvyn Novas spent a total of 160 minutes in interpretation and report writing billed as one unit [phone] and two units [id number].

## 2022-07-11 NOTE — Progress Notes (Signed)
   Psychometrician Note   Cognitive testing was administered to Richard Harmon by Cruzita Lederer, B.S. (psychometrist) under the supervision of Dr. Christia Reading, Ph.D., licensed psychologist on 07/11/2022. Richard Harmon did not appear overtly distressed by the testing session per behavioral observation or responses across self-report questionnaires. Rest breaks were offered.    The battery of tests administered was selected by Dr. Christia Reading, Ph.D. with consideration to Richard Harmon current level of functioning, the nature of his symptoms, emotional and behavioral responses during interview, level of literacy, observed level of motivation/effort, and the nature of the referral question. This battery was communicated to the psychometrist. Communication between Dr. Christia Reading, Ph.D. and the psychometrist was ongoing throughout the evaluation and Dr. Christia Reading, Ph.D. was immediately accessible at all times. Dr. Christia Reading, Ph.D. provided supervision to the psychometrist on the date of this service to the extent necessary to assure the quality of all services provided.    Richard Harmon will return within approximately 1-2 weeks for an interactive feedback session with Dr. Melvyn Novas at which time his test performances, clinical impressions, and treatment recommendations will be reviewed in detail. Richard Harmon understands he can contact our office should he require our assistance before this time.  A total of 125 minutes of billable time were spent face-to-face with Richard Harmon by the psychometrist. This includes both test administration and scoring time. Billing for these services is reflected in the clinical report generated by Dr. Christia Reading, Ph.D.  This note reflects time spent with the psychometrician and does not include test scores or any clinical interpretations made by Dr. Melvyn Novas. The full report will follow in a separate note.

## 2022-07-18 ENCOUNTER — Encounter: Payer: Self-pay | Admitting: Physician Assistant

## 2022-07-18 ENCOUNTER — Ambulatory Visit: Payer: Medicare PPO | Admitting: Psychology

## 2022-07-18 DIAGNOSIS — G3184 Mild cognitive impairment, so stated: Secondary | ICD-10-CM

## 2022-07-18 DIAGNOSIS — Z8616 Personal history of COVID-19: Secondary | ICD-10-CM | POA: Diagnosis not present

## 2022-07-18 NOTE — Progress Notes (Signed)
   Neuropsychology Feedback Session [name]. [facility]  Reason for Referral:   Richard Harmon is a 71 y.o. right-handed Caucasian male referred by Sharene Butters, PA-C, to characterize his current cognitive functioning and assist with diagnostic clarity and treatment planning in the context of subjective cognitive decline.   Feedback:   [name] completed a comprehensive neuropsychological evaluation on 07/11/2022. Please refer to that encounter for the full report and recommendations. Briefly, results suggested primary impairments across verbal fluency (semantic worse than phonemic) and verbal learning and memory. Additional deficits were exhibited across executive functioning, particularly cognitive flexibility and abstract reasoning. Performance variability was exhibited across processing speed. Regarding etiology, there remains the possibility that his COVID-19 history could be influencing ongoing cognitive functioning. Areas commonly reported to be affected would include processing speed, executive functioning, verbal fluency, and certain aspects of memory. Across verbal memory tasks, there was some evidence to suggest rapid forgetting and an evolving storage impairment. Retention rates of 0% (list learning task), 24% (daily living task) and 78% (story learning task) suggest ongoing variability but also overall weakness. Rapid forgetting and storage dysfunction can be an early sign of Alzheimer's disease. Impaired semantic fluency is also seen early on in this illness, as would reports by his wife via medical records that [name] is repetitive in conversation and may not have proper insight into memory impairment. However, it is important to highlight that visual memory was strong, as was confrontation naming. While the very early stages of this illness cannot be ruled out, it also cannot be ruled in based upon the current evaluation. Continued  monitoring over time will be very important.   [name] was accompanied by his wife during the current feedback session. Content of the current session focused on the results of his neuropsychological evaluation. [name] was given the opportunity to ask questions and his questions were answered. He was encouraged to reach out should additional questions arise. A copy of his report was provided at the conclusion of the visit.      45 minutes were spent conducting the current feedback session with [name], billed as one unit [phone].

## 2022-07-19 ENCOUNTER — Other Ambulatory Visit: Payer: Self-pay | Admitting: Physician Assistant

## 2022-07-19 ENCOUNTER — Encounter: Payer: Self-pay | Admitting: Physician Assistant

## 2022-07-19 MED ORDER — MEMANTINE HCL 5 MG PO TABS: 5.0000 mg | ORAL_TABLET | Freq: Two times a day (BID) | ORAL | 4 refills | Status: DC

## 2022-08-14 DIAGNOSIS — Z0000 Encounter for general adult medical examination without abnormal findings: Secondary | ICD-10-CM | POA: Diagnosis not present

## 2022-08-14 DIAGNOSIS — E785 Hyperlipidemia, unspecified: Secondary | ICD-10-CM | POA: Diagnosis not present

## 2022-08-14 DIAGNOSIS — R7309 Other abnormal glucose: Secondary | ICD-10-CM | POA: Diagnosis not present

## 2022-08-14 DIAGNOSIS — E291 Testicular hypofunction: Secondary | ICD-10-CM | POA: Diagnosis not present

## 2022-08-14 DIAGNOSIS — I10 Essential (primary) hypertension: Secondary | ICD-10-CM | POA: Diagnosis not present

## 2022-08-21 DIAGNOSIS — R062 Wheezing: Secondary | ICD-10-CM | POA: Diagnosis not present

## 2022-08-21 DIAGNOSIS — B9689 Other specified bacterial agents as the cause of diseases classified elsewhere: Secondary | ICD-10-CM | POA: Diagnosis not present

## 2022-08-21 DIAGNOSIS — J988 Other specified respiratory disorders: Secondary | ICD-10-CM | POA: Diagnosis not present

## 2022-08-24 ENCOUNTER — Ambulatory Visit: Payer: Medicare PPO | Admitting: Physician Assistant

## 2022-08-24 ENCOUNTER — Encounter: Payer: Self-pay | Admitting: Physician Assistant

## 2022-08-24 VITALS — BP 170/88 | HR 80 | Resp 18 | Ht 69.0 in | Wt 189.0 lb

## 2022-08-24 DIAGNOSIS — G3184 Mild cognitive impairment, so stated: Secondary | ICD-10-CM | POA: Diagnosis not present

## 2022-08-24 NOTE — Progress Notes (Addendum)
Richard Harmon is a very pleasant 71 y.o. RH male presenting today in follow-up for evaluation of memory loss. Patient is on memantine 5 mg twice daily.  MRI of the brain, personally reviewed is remarkable for mild parenchymal volume loss, early chronic microangiopathy, but no acute findings.  Neurocognitive testing 07/18/2022 yielded a diagnosis of mild cognitive impairment, however the etiology is still unclear or unknown (referred to well detailed note for details).  The patient is on memantine 5 mg twice daily, tolerating well.  Patient reports that may be experiencing anxiety as well.  Today's MMSE was 27/30, stable from prior testing.  He did mention that he would prefer that his wife not be present during the future visits with [place], as these adds stress to his visit.   Recommendations:   Follow up in   months. Continue memantine 5 mg twice daily, side effects discussed Continue to control cardiovascular risk factors, continue aspirin daily Repeat neurocognitive testing in 12 months for clarity of the diagnosis and disease trajectory, (he is already scheduled for 07/20/2023) CBT information has been provided to him and his wife. Continue to control mood as per PCP    Subjective:   This patient is accompanied in the office by his wife who supplements the history. Previous records as well as any outside records available were reviewed prior to todays visit.  He was last seen at our office on 02/22/2022.  MoCA was 21/30 on 01/18/2022 he is on memantine 5 mg twice daily   Any changes in memory since last visit?  "No further brain fog ". Occasionally forgets a conversations. He recognizes children and grandchildren, and names of people he works with.  He is wife disagrees, complaining that he has a rapid decline in his short-term memory, although the patient reports that he is not sure of that.  In fact, as the patient his wife left the room he reported that "she drives me crazy ".  He  admits that his wife's "extreme concern affects my performance ".  He reports that his long-term memory is good.   Repeats oneself?  Endorsed, more often according to his wife.  Disoriented when walking into a room?  Patient denies except occasionally not remembering what patient came to the room for   Leaving objects in unusual places?  Patient denies   Wandering behavior?  Patient denies   Any personality changes since last visit?  Patient denies   Any worsening depression?:  Patient denies   Hallucinations or paranoia?  Patient denies   Seizures?   Patient denies    Any sleep changes?  Denies  vivid dreams, REM behavior or sleepwalking   Sleep apnea?  Patient denies   Any hygiene concerns?  Patient denies   Independent of bathing and dressing?  Endorsed  Does the patient needs help with medications?  Patient is in charge  Who is in charge of the finances?  Patient is in charge    Any changes in appetite?  Patient denies    Patient have trouble swallowing? Patient denies   Does the patient cook?  Any kitchen accidents such as leaving the stove on? Patient denies   Any headaches?  Patient denies   Chronic back pain Patient denies   Ambulates with difficulty?   Patient denies   Recent falls or head injuries?  Patient denies     Unilateral weakness, numbness or tingling?  Patient denies   Any tremors?  Patient denies  Any anosmia?  Patient denies   Any incontinence of urine?  Patient denies   Any bowel dysfunction?   Patient denies      Patient lives with his wife. Does the patient drive?  Endorsed.  Neurocognitive evaluation 07/18/2022, Dr. Melvyn Novas: Briefly, results suggested primary impairments across verbal fluency (semantic worse than phonemic) and verbal learning and memory. Additional deficits were exhibited across executive functioning, particularly cognitive flexibility and abstract reasoning. Performance variability was exhibited across processing speed. Regarding etiology, there  remains the possibility that his COVID-19 history could be influencing ongoing cognitive functioning. Areas commonly reported to be affected would include processing speed, executive functioning, verbal fluency, and certain aspects of memory. Across verbal memory tasks, there was some evidence to suggest rapid forgetting and an evolving storage impairment. Retention rates of 0% (list learning task), 24% (daily living task) and 78% (story learning task) suggest ongoing variability but also overall weakness. Rapid forgetting and storage dysfunction can be an early sign of Alzheimer's disease. Impaired semantic fluency is also seen early on in this illness, as would reports by his wife via medical records that Mr. Nelis is repetitive in conversation and may not have proper insight into memory impairment. However, it is important to highlight that visual memory was strong, as was confrontation naming. While the very early stages of this illness cannot be ruled out, it also cannot be ruled in based upon the current evaluation. Continued monitoring over time will be very important.   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   Testosterone deficiency in male 02/11/2019     Past Surgical History:  Procedure Laterality Date   BASAL CELL CARCINOMA EXCISION  06/2017   BICEPS TENDON REPAIR  07/05/2018   CATARACT EXTRACTION Left 10/05/2009   HERNIA REPAIR  2007   TONSILLECTOMY      TOTAL KNEE ARTHROPLASTY Left 03/04/2019   Procedure: TOTAL KNEE ARTHROPLASTY;  Surgeon: Renette Butters, MD;  Location: [facility];  Service: Orthopedics;  Laterality: Left;     PREVIOUS MEDICATIONS:   CURRENT MEDICATIONS:  Outpatient Encounter Medications as of 08/24/2022  Medication Sig   amLODipine (NORVASC) 5 MG tablet Take 5 mg by mouth every evening.   atorvastatin (LIPITOR) 40 MG tablet Take 40 mg by mouth every evening.   Krill Oil 500 MG CAPS Take 500 mg by mouth every evening.   Melatonin 10 MG CAPS Take 10 mg by mouth at bedtime.   memantine (NAMENDA) 5 MG tablet Take 1 tablet (5 mg total) by mouth 2 (two) times daily.   Nutritional Supplements (VITAMIN D BOOSTER PO) Take 2,000 mg by mouth.   Testosterone 25 MG/2.5GM (1%) GEL Place 1 application onto the skin daily.   valsartan-hydrochlorothiazide (DIOVAN-HCT) 320-25 MG tablet Take 1 tablet by mouth every evening.   Carboxymethylcellul-Glycerin (LUBRICATING EYE DROPS OP) Place 1 drop into both eyes daily as needed (irritation).   Cholecalciferol (DIALYVITE VITAMIN D 5000) 125 MCG (5000 UT) capsule Take 5,000 Units by mouth  every evening.   docusate sodium (COLACE) 100 MG capsule Take 1 capsule (100 mg total) by mouth 2 (two) times daily. To prevent constipation while taking pain medication.   omeprazole (PRILOSEC) 20 MG capsule Take 1 capsule (20 mg total) by mouth daily. 30 days for gastroprotection while taking NSAIDs.   ondansetron (ZOFRAN) 4 MG tablet Take 1 tablet (4 mg total) by mouth every 8 (eight) hours as needed for nausea or vomiting.   [DISCONTINUED] aspirin EC 81 MG tablet Take 1 tablet (81 mg total) by mouth 2 (two) times daily. For DVT prophylaxis for 30 days after surgery.   [DISCONTINUED] baclofen (LIORESAL) 10 MG tablet Take 1 tablet (10 mg total) by mouth 3 (three) times daily as needed for muscle spasms.   No facility-administered encounter medications on file as of 08/24/2022.     Objective:      PHYSICAL EXAMINATION:    VITALS:   Vitals:   08/24/22 0852  BP: (!) 170/88  Pulse: 80  Resp: 18  SpO2: 96%  Weight: 189 lb (85.7 kg)  Height: '5\' 9"'$  (1.753 m)    GEN:  The patient appears stated age and is in NAD. HEENT:  Normocephalic, atraumatic.   Neurological examination:  General: NAD, well-groomed, appears stated age. Anxious appearing.  Orientation: The patient is alert. Oriented to person, place and date Cranial nerves: There is good facial symmetry.The speech is fluent and clear. No aphasia or dysarthria. Fund of knowledge is appropriate. Recent memory impaired and remote memory is normal.  Attention and concentration are normal.  Able to name objects and repeat phrases.  Hearing is intact to conversational tone.    Sensation: Sensation is intact to light touch throughout Motor: Strength is at least antigravity x4. Tremors: none  DTR's 2/4 in UE/LE      01/18/2022    8:00 AM  Montreal Cognitive Assessment   Visuospatial/ Executive (0/5) 4  Naming (0/3) 2  Attention: Read list of digits (0/2) 2  Attention: Read list of letters (0/1) 1  Attention: Serial 7 subtraction starting at 100 (0/3) 3  Language: Repeat phrase (0/2) 2  Language : Fluency (0/1) 0  Abstraction (0/2) 2  Delayed Recall (0/5) 0  Orientation (0/6) 5  Total 21       08/24/2022    9:00 AM  MMSE - Mini Mental State Exam  Orientation to time 4  Orientation to Place 5  Registration 3  Attention/ Calculation 5  Recall 1  Language- name 2 objects 2  Language- repeat 1  Language- follow 3 step command 3  Language- read & follow direction 1  Write a sentence 1  Copy design 1  Total score 27       Movement examination: Tone: There is normal tone in the UE/LE Abnormal movements:  no tremor.  No myoclonus.  No asterixis.   Coordination:  There is no decremation with RAM's. Normal finger to nose  Gait and Station: The patient has no difficulty arising out of a deep-seated chair without the  use of the hands. The patient's stride length is good.  Gait is cautious and narrow.   Thank you for allowing [place] the opportunity to participate in the care of this nice patient. Please do not hesitate to contact us for any questions or concerns.   Total time spent on today's visit was 36 minutes dedicated to this patient today, preparing to see patient, examining the patient, ordering tests and/or medications and counseling the patient, documenting clinical  information in the EHR or other health record, independently interpreting results and communicating results to the patient/family, discussing treatment and goals, answering patient's questions and coordinating care.  Cc:  London Pepper, MD  Sharene Butters 08/25/2022 3:10 PM

## 2022-08-24 NOTE — Patient Instructions (Signed)
It was a pleasure to see you today at our office.   Recommendations:  Hold the memantine for 1 week and see what happens  Recommend CBT Follow up in 6 months  Whom to call:  Memory  decline, memory medications: Call out office [phone]   For psychiatric meds, mood meds: Please have your primary care physician manage these medications.       RECOMMENDATIONS FOR ALL PATIENTS WITH MEMORY PROBLEMS: 1. Continue to exercise (Recommend 30 minutes of walking everyday, or 3 hours every week) 2. Increase social interactions - continue going to [place] and enjoy social gatherings with friends and family 3. Eat healthy, avoid fried foods and eat more fruits and vegetables 4. Maintain adequate blood pressure, blood sugar, and blood cholesterol level. Reducing the risk of stroke and cardiovascular disease also helps promoting better memory. 5. Avoid stressful situations. Live a simple life and avoid aggravations. Organize your time and prepare for the next day in anticipation. 6. Sleep well, avoid any interruptions of sleep and avoid any distractions in the bedroom that may interfere with adequate sleep quality 7. Avoid sugar, avoid sweets as there is a strong link between excessive sugar intake, diabetes, and cognitive impairment We discussed the Mediterranean diet, which has been shown to help patients reduce the risk of progressive memory disorders and reduces cardiovascular risk. This includes eating fish, eat fruits and green leafy vegetables, nuts like almonds and hazelnuts, walnuts, and also use olive oil. Avoid fast foods and fried foods as much as possible. Avoid sweets and sugar as sugar use has been linked to worsening of memory function.  There is always a concern of gradual progression of memory problems. If this is the case, then we may need to adjust level of care according to patient needs. Support, both to the patient and caregiver, should then be put into place.       FALL  PRECAUTIONS: Be cautious when walking. Scan the area for obstacles that may increase the risk of trips and falls. When getting up in the mornings, sit up at the edge of the bed for a few minutes before getting out of bed. Consider elevating the bed at the head end to avoid drop of blood pressure when getting up. Walk always in a well-lit room (use night lights in the walls). Avoid area rugs or power cords from appliances in the middle of the walkways. Use a walker or a cane if necessary and consider physical therapy for balance exercise. Get your eyesight checked regularly.  FINANCIAL OVERSIGHT: Supervision, especially oversight when making financial decisions or transactions is also recommended.  HOME SAFETY: Consider the safety of the kitchen when operating appliances like stoves, microwave oven, and blender. Consider having supervision and share cooking responsibilities until no longer able to participate in those. Accidents with firearms and other hazards in the house should be identified and addressed as well.   ABILITY TO BE LEFT ALONE: If patient is unable to contact 911 operator, consider using LifeLine, or when the need is there, arrange for someone to stay with patients. Smoking is a fire hazard, consider supervision or cessation. Risk of wandering should be assessed by caregiver and if detected at any point, supervision and safe proof recommendations should be instituted.  MEDICATION SUPERVISION: Inability to self-administer medication needs to be constantly addressed. Implement a mechanism to ensure safe administration of the medications.   DRIVING: Regarding driving, in patients with progressive memory problems, driving will be impaired. We advise to have someone  else do the driving if trouble finding directions or if minor accidents are reported. Independent driving assessment is available to determine safety of driving.   If you are interested in the driving assessment, you can contact  the following:  [employer] in [place]  [place] [phone] or [phone]    [place] refers to food and lifestyle choices that are based on the traditions of countries located on the [employer]. This way of eating has been shown to help prevent certain conditions and improve outcomes for people who have chronic diseases, like kidney disease and heart disease. What are tips for following this plan? Lifestyle  Cook and eat meals together with your family, when possible. Drink enough fluid to keep your urine clear or pale yellow. Be physically active every day. This includes: Aerobic exercise like running or swimming. Leisure activities like gardening, walking, or housework. Get 7-8 hours of sleep each night. If recommended by your health care provider, drink red wine in moderation. This means 1 glass a day for nonpregnant women and 2 glasses a day for men. A glass of wine equals 5 oz (150 mL). Reading food labels  Check the serving size of packaged foods. For foods such as rice and pasta, the serving size refers to the amount of cooked product, not dry. Check the total fat in packaged foods. Avoid foods that have saturated fat or trans fats. Check the ingredients list for added sugars, such as corn syrup. Shopping  At the grocery store, buy most of your food from the areas near the walls of the store. This includes: Fresh fruits and vegetables (produce). Grains, beans, nuts, and seeds. Some of these may be available in unpackaged forms or large amounts (in bulk). Fresh seafood. Poultry and eggs. Low-fat dairy products. Buy whole ingredients instead of prepackaged foods. Buy fresh fruits and vegetables in-season from local farmers markets. Buy frozen fruits and vegetables in resealable bags. If you do not have access to  quality fresh seafood, buy precooked frozen shrimp or canned fish, such as tuna, salmon, or sardines. Buy small amounts of raw or cooked vegetables, salads, or olives from the deli or salad bar at your store. Stock your pantry so you always have certain foods on hand, such as olive oil, canned tuna, canned tomatoes, rice, pasta, and beans. Cooking  Cook foods with extra-virgin olive oil instead of using butter or other vegetable oils. Have meat as a side dish, and have vegetables or grains as your main dish. This means having meat in small portions or adding small amounts of meat to foods like pasta or stew. Use beans or vegetables instead of meat in common dishes like chili or lasagna. Experiment with different cooking methods. Try roasting or broiling vegetables instead of steaming or sauteing them. Add frozen vegetables to soups, stews, pasta, or rice. Add nuts or seeds for added healthy fat at each meal. You can add these to yogurt, salads, or vegetable dishes. Marinate fish or vegetables using olive oil, lemon juice, garlic, and fresh herbs. Meal planning  Plan to eat 1 vegetarian meal one day each week. Try to work up to 2 vegetarian meals, if possible. Eat seafood 2 or more times a week. Have healthy snacks readily available, such as: Vegetable sticks with hummus. Greek yogurt. Fruit and nut trail mix. Eat balanced meals throughout the week. This includes: Fruit: 2-3 servings a day  Vegetables: 4-5 servings a day Low-fat dairy: 2 servings a day Fish, poultry, or lean meat: 1 serving a day Beans and legumes: 2 or more servings a week Nuts and seeds: 1-2 servings a day Whole grains: 6-8 servings a day Extra-virgin olive oil: 3-4 servings a day Limit red meat and sweets to only a few servings a month What are my food choices? Mediterranean diet Recommended Grains: Whole-grain pasta. Brown rice. Bulgar wheat. Polenta. Couscous. Whole-wheat bread. [place]. Vegetables:  Artichokes. Beets. Broccoli. Cabbage. Carrots. Eggplant. Green beans. Chard. Kale. Spinach. Onions. Leeks. Peas. Squash. Tomatoes. Peppers. Radishes. Fruits: Apples. Apricots. Avocado. Berries. Bananas. Cherries. Dates. Figs. Grapes. Lemons. Melon. Oranges. Peaches. Plums. Pomegranate. Meats and other protein foods: Beans. Almonds. Sunflower seeds. Pine nuts. Peanuts. [place]. Salmon. Scallops. Shrimp. [place]. Tilapia. Clams. Oysters. Eggs. Dairy: Low-fat milk. Cheese. Greek yogurt. Beverages: Water. Red wine. Herbal tea. Fats and oils: Extra virgin olive oil. Avocado oil. Grape seed oil. Sweets and desserts: [place] yogurt with honey. Baked apples. Poached pears. Trail mix. Seasoning and other foods: Basil. Cilantro. Coriander. Cumin. Mint. Parsley. Sage. Rosemary. Tarragon. Garlic. Oregano. Thyme. Pepper. Balsalmic vinegar. Tahini. Hummus. Tomato sauce. Olives. Mushrooms. Limit these Grains: Prepackaged pasta or rice dishes. Prepackaged cereal with added sugar. Vegetables: Deep fried potatoes (french fries). Fruits: Fruit canned in syrup. Meats and other protein foods: Beef. Pork. Lamb. Poultry with skin. Hot dogs. [place] Salines. Dairy: Ice cream. Sour cream. Whole milk. Beverages: Juice. Sugar-sweetened soft drinks. Beer. Liquor and spirits. Fats and oils: Butter. Canola oil. Vegetable oil. Beef fat (tallow). Lard. Sweets and desserts: Cookies. Cakes. Pies. Candy. Seasoning and other foods: Mayonnaise. Premade sauces and marinades. The items listed may not be a complete list. Talk with your dietitian about what dietary choices are right for you. Summary The Mediterranean diet includes both food and lifestyle choices. Eat a variety of fresh fruits and vegetables, beans, nuts, seeds, and whole grains. Limit the amount of red meat and sweets that you eat. Talk with your health care provider about whether it is safe for you to drink red wine in moderation. This means 1 glass a day for nonpregnant women and 2  glasses a day for men. A glass of wine equals 5 oz (150 mL). This information is not intended to replace advice given to you by your health care provider. Make sure you discuss any questions you have with your health care provider. Document Released: 04/13/2016 Document Revised: 05/16/2016 Document Reviewed: 04/13/2016 Elsevier Interactive Patient Education  2017 [employer].   We have sent a referral to [facility] for your MRI and they will call you directly to schedule your appointment. They are located at [place]. If you need to contact them directly please call [phone].

## 2022-09-05 DIAGNOSIS — H6593 Unspecified nonsuppurative otitis media, bilateral: Secondary | ICD-10-CM | POA: Diagnosis not present

## 2022-09-05 DIAGNOSIS — J3489 Other specified disorders of nose and nasal sinuses: Secondary | ICD-10-CM | POA: Diagnosis not present

## 2022-09-05 DIAGNOSIS — J40 Bronchitis, not specified as acute or chronic: Secondary | ICD-10-CM | POA: Diagnosis not present

## 2022-09-29 DIAGNOSIS — H40053 Ocular hypertension, bilateral: Secondary | ICD-10-CM | POA: Diagnosis not present

## 2022-12-20 DIAGNOSIS — L821 Other seborrheic keratosis: Secondary | ICD-10-CM | POA: Diagnosis not present

## 2022-12-20 DIAGNOSIS — L570 Actinic keratosis: Secondary | ICD-10-CM | POA: Diagnosis not present

## 2022-12-20 DIAGNOSIS — Z08 Encounter for follow-up examination after completed treatment for malignant neoplasm: Secondary | ICD-10-CM | POA: Diagnosis not present

## 2022-12-20 DIAGNOSIS — L814 Other melanin hyperpigmentation: Secondary | ICD-10-CM | POA: Diagnosis not present

## 2022-12-20 DIAGNOSIS — Z85828 Personal history of other malignant neoplasm of skin: Secondary | ICD-10-CM | POA: Diagnosis not present

## 2022-12-20 DIAGNOSIS — D225 Melanocytic nevi of trunk: Secondary | ICD-10-CM | POA: Diagnosis not present

## 2023-01-03 IMAGING — MR MR HEAD W/O CM
10 series · 48 of 48 positions shown · non-contrast
Comparison: None Available.

CLINICAL DATA: Memory loss JMK.E (MZC-UO-CM).

EXAM:
MRI HEAD WITHOUT CONTRAST
TECHNIQUE: Multiplanar, multiecho pulse sequences of the brain and surrounding
structures were obtained without intravenous contrast.

[Series 2: T1 · sagittal · 5.0mm · 0.45mm/px · 3 of 22 slices shown]
[im 1/22]
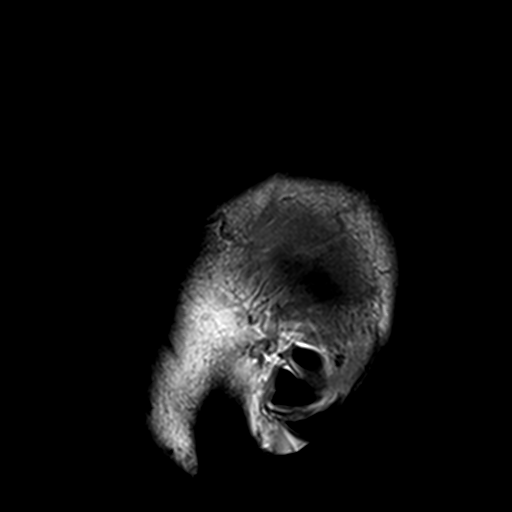
[im 11/22]
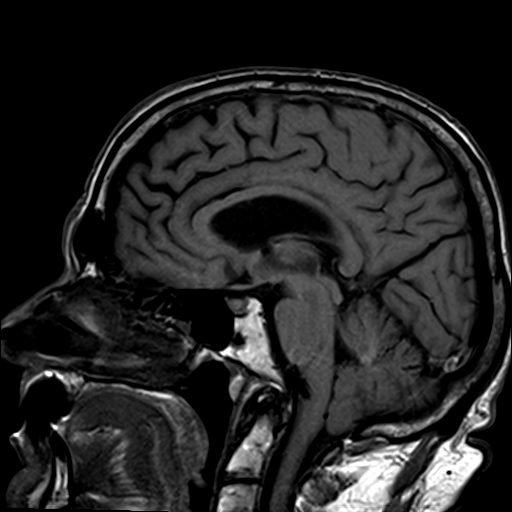
[im 22/22]
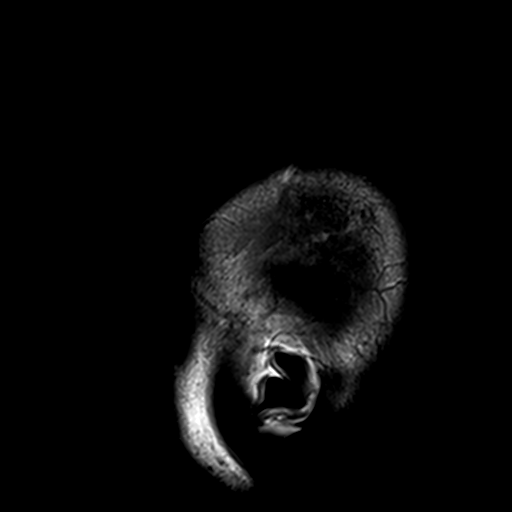

[Series 3: DWI · axial · 3.0mm · 1.80mm/px · z∈[-60,+102]mm · 9 of 114 slices shown (1 of 4)]
[im 1/114]
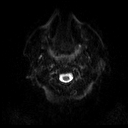
[im 15/114]
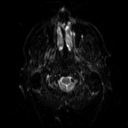
[im 29/114]
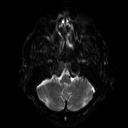
[im 43/114]
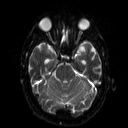
[im 57/114]
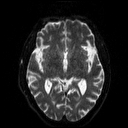
[im 71/114]
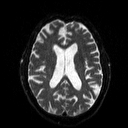
[im 85/114]
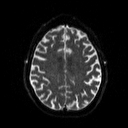
[im 99/114]
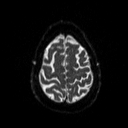
[im 114/114]
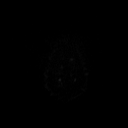

[Series 4: DWI · axial · 3.0mm · 1.80mm/px · z∈[-60,+102]mm · 4 of 56 slices shown (2 of 4)]
[im 1/56]
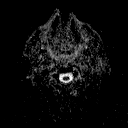
[im 19/56]
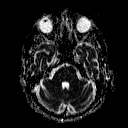
[im 37/56]
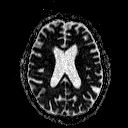
[im 56/56]
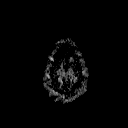

[Series 5: DWI · coronal · 5.0mm · 1.80mm/px · 6 of 78 slices shown (3 of 4)]
[im 1/78]
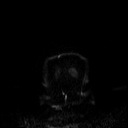
[im 16/78]
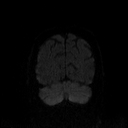
[im 31/78]
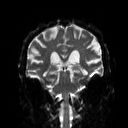
[im 47/78]
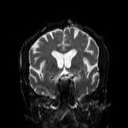
[im 62/78]
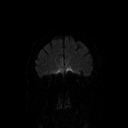
[im 78/78]
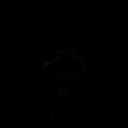

[Series 6: DWI · coronal · 5.0mm · 1.80mm/px · 3 of 38 slices shown (4 of 4)]
[im 1/38]
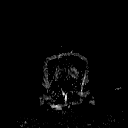
[im 19/38]
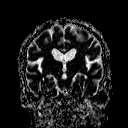
[im 38/38]
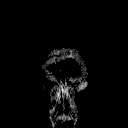

[Series 7: T2 · axial · 5.0mm · 0.60mm/px · z∈[-58,+104]mm · 2 of 25 slices shown (1 of 2)]
[im 1/25]
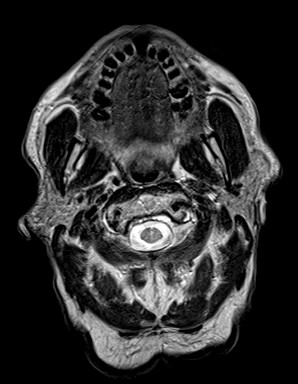
[im 25/25]
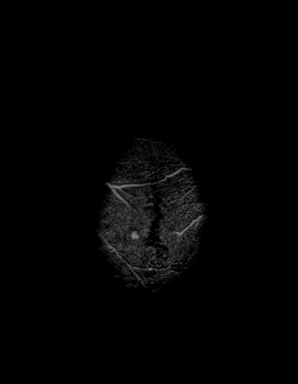

[Series 8: FLAIR · axial · 3.0mm · 0.45mm/px · z∈[-58,+103]mm · 3 of 37 slices shown]
[im 1/37]
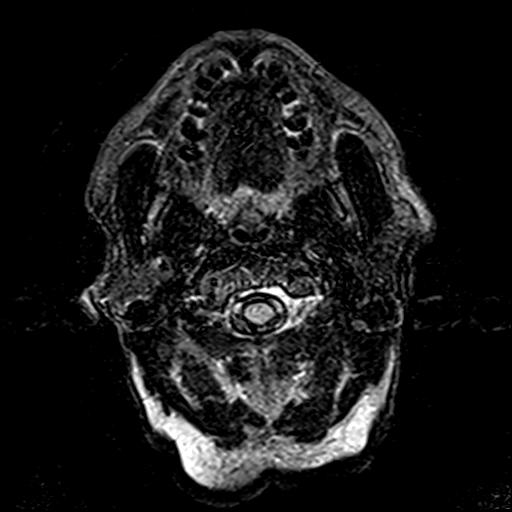
[im 19/37]
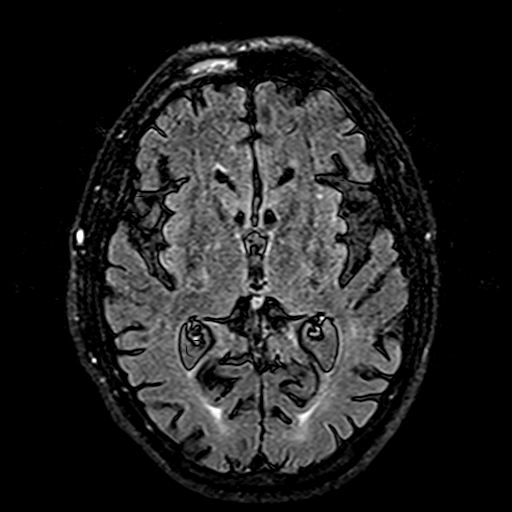
[im 37/37]
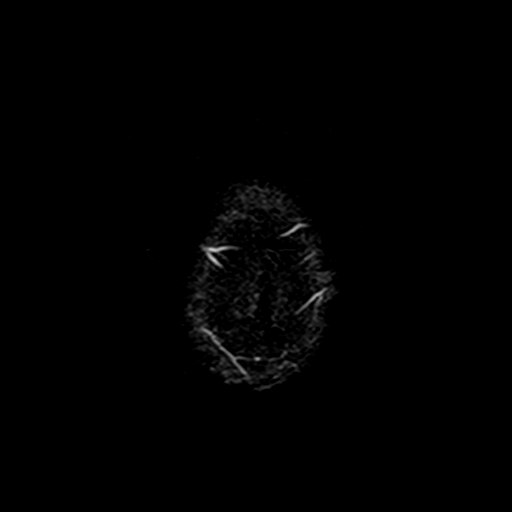

[Series 10: swi_images · axial · 4.0mm · 0.90mm/px · z∈[-53,+98]mm · 3 of 40 slices shown]
[im 1/40]
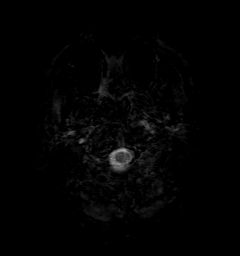
[im 20/40]
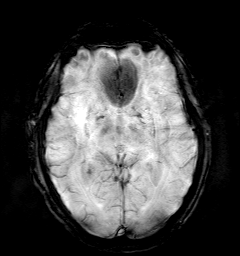
[im 40/40]
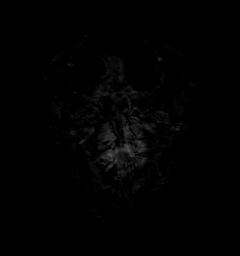

[Series 11: t1_mpr_tra · axial · 1.0mm · 0.71mm/px · z∈[-54,+100]mm · 13 of 160 slices shown]
[im 1/160]
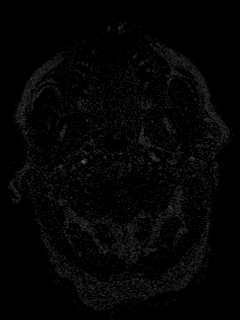
[im 14/160]
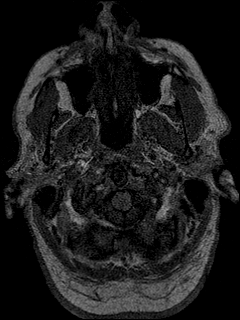
[im 27/160]
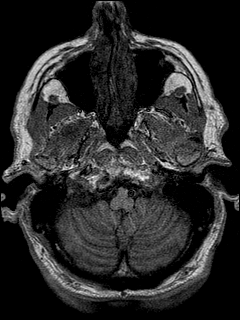
[im 40/160]
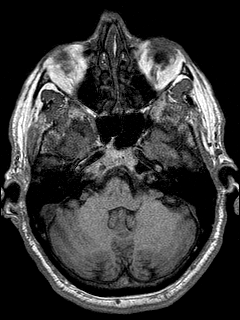
[im 54/160]
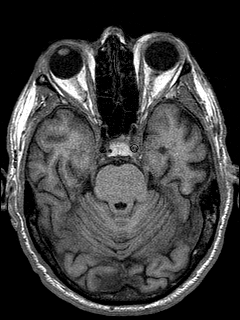
[im 67/160]
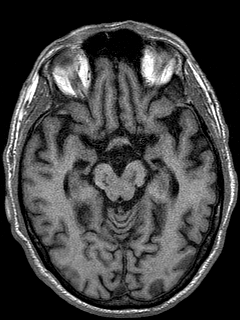
[im 80/160]
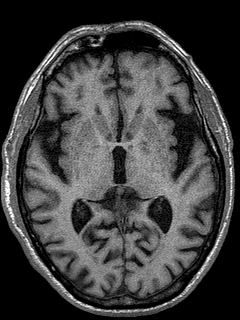
[im 93/160]
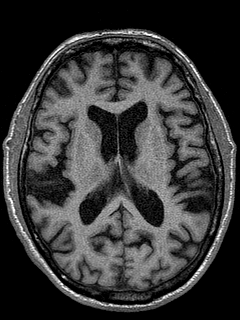
[im 107/160]
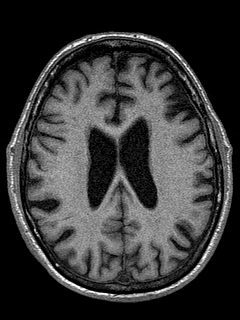
[im 120/160]
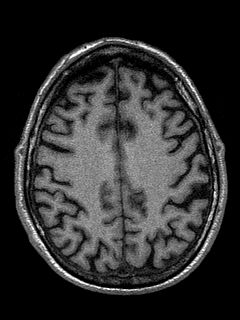
[im 133/160]
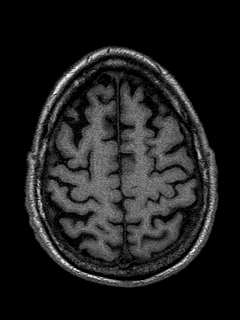
[im 146/160]
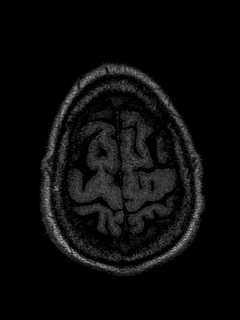
[im 160/160]
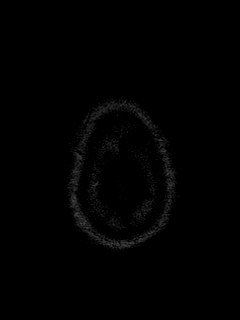

[Series 12: T2 · coronal · 5.0mm · 0.45mm/px · 2 of 31 slices shown (2 of 2)]
[im 1/31]
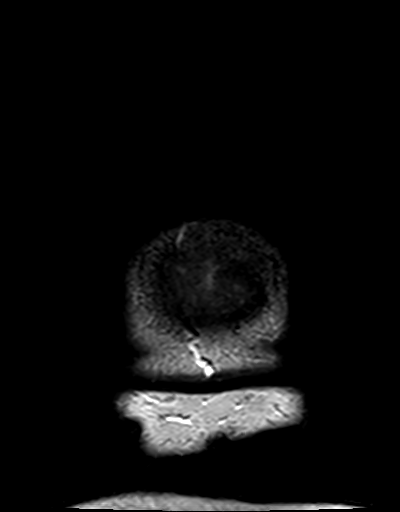
[im 31/31]
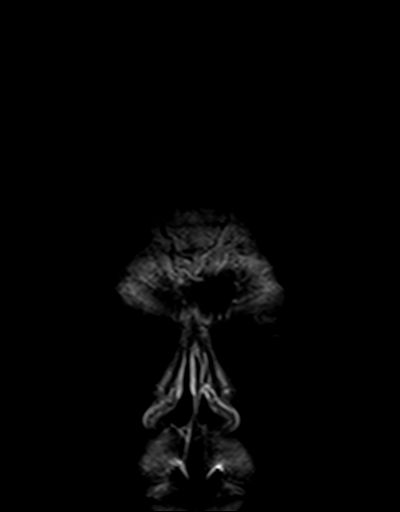

[48 of 48 positions shown; findings below may reference images not displayed]

FINDINGS: Brain: No acute infarction, hemorrhage, hydrocephalus, extra-axial
collection or mass lesion.

Low lying cerebellar tonsils with maintained round configuration.
Minimal patchy T2 hyperintensity in the periventricular white
matter, nonspecific, most likely related to early chronic
microangiopathy. Mild parenchymal volume loss.

Vascular: Normal flow voids.

Skull and upper cervical spine: Normal marrow signal.

Sinuses/Orbits: Trace mucosal thickening in the bilateral ethmoid
cells. Left lens surgery.

Other: None.
IMPRESSION: 1. Minimal amount of nonspecific T2 hyperintense lesions of the
white matter, may represent early chronic microangiopathy.
2. Mild parenchymal volume loss.
3. Low lying cerebellar tonsils.

## 2023-02-14 DIAGNOSIS — E785 Hyperlipidemia, unspecified: Secondary | ICD-10-CM | POA: Diagnosis not present

## 2023-02-14 DIAGNOSIS — Z0000 Encounter for general adult medical examination without abnormal findings: Secondary | ICD-10-CM | POA: Diagnosis not present

## 2023-02-14 DIAGNOSIS — R7309 Other abnormal glucose: Secondary | ICD-10-CM | POA: Diagnosis not present

## 2023-02-14 DIAGNOSIS — R011 Cardiac murmur, unspecified: Secondary | ICD-10-CM | POA: Diagnosis not present

## 2023-02-14 DIAGNOSIS — G3184 Mild cognitive impairment, so stated: Secondary | ICD-10-CM | POA: Diagnosis not present

## 2023-02-14 DIAGNOSIS — I10 Essential (primary) hypertension: Secondary | ICD-10-CM | POA: Diagnosis not present

## 2023-02-23 ENCOUNTER — Encounter: Payer: Self-pay | Admitting: Physician Assistant

## 2023-02-23 ENCOUNTER — Ambulatory Visit: Payer: Medicare PPO | Admitting: Physician Assistant

## 2023-02-23 VITALS — BP 134/77 | HR 68 | Resp 18 | Ht 69.0 in | Wt 194.0 lb

## 2023-02-23 DIAGNOSIS — G3184 Mild cognitive impairment, so stated: Secondary | ICD-10-CM

## 2023-02-23 NOTE — Patient Instructions (Signed)
It was a pleasure to see you today at our office.   Recommendations:  Continue Memantine 5 mg twice daily. Side effects were discussed  Neuropsych testing for clarity of diagnosis and disease trajectory  scheduled for 07/2023  Follow up in 6 months  Whom to call:  Memory  decline, memory medications: Call out office [phone]   For psychiatric meds, mood meds: Please have your primary care physician manage these medications.       RECOMMENDATIONS FOR ALL PATIENTS WITH MEMORY PROBLEMS: 1. Continue to exercise (Recommend 30 minutes of walking everyday, or 3 hours every week) 2. Increase social interactions - continue going to [place] and enjoy social gatherings with friends and family 3. Eat healthy, avoid fried foods and eat more fruits and vegetables 4. Maintain adequate blood pressure, blood sugar, and blood cholesterol level. Reducing the risk of stroke and cardiovascular disease also helps promoting better memory. 5. Avoid stressful situations. Live a simple life and avoid aggravations. Organize your time and prepare for the next day in anticipation. 6. Sleep well, avoid any interruptions of sleep and avoid any distractions in the bedroom that may interfere with adequate sleep quality 7. Avoid sugar, avoid sweets as there is a strong link between excessive sugar intake, diabetes, and cognitive impairment We discussed the Mediterranean diet, which has been shown to help patients reduce the risk of progressive memory disorders and reduces cardiovascular risk. This includes eating fish, eat fruits and green leafy vegetables, nuts like almonds and hazelnuts, walnuts, and also use olive oil. Avoid fast foods and fried foods as much as possible. Avoid sweets and sugar as sugar use has been linked to worsening of memory function.  There is always a concern of gradual progression of memory problems. If this is the case, then we may need to adjust level of care according to patient needs.  Support, both to the patient and caregiver, should then be put into place.       FALL PRECAUTIONS: Be cautious when walking. Scan the area for obstacles that may increase the risk of trips and falls. When getting up in the mornings, sit up at the edge of the bed for a few minutes before getting out of bed. Consider elevating the bed at the head end to avoid drop of blood pressure when getting up. Walk always in a well-lit room (use night lights in the walls). Avoid area rugs or power cords from appliances in the middle of the walkways. Use a walker or a cane if necessary and consider physical therapy for balance exercise. Get your eyesight checked regularly.  FINANCIAL OVERSIGHT: Supervision, especially oversight when making financial decisions or transactions is also recommended.  HOME SAFETY: Consider the safety of the kitchen when operating appliances like stoves, microwave oven, and blender. Consider having supervision and share cooking responsibilities until no longer able to participate in those. Accidents with firearms and other hazards in the house should be identified and addressed as well.   ABILITY TO BE LEFT ALONE: If patient is unable to contact 911 operator, consider using LifeLine, or when the need is there, arrange for someone to stay with patients. Smoking is a fire hazard, consider supervision or cessation. Risk of wandering should be assessed by caregiver and if detected at any point, supervision and safe proof recommendations should be instituted.  MEDICATION SUPERVISION: Inability to self-administer medication needs to be constantly addressed. Implement a mechanism to ensure safe administration of the medications.   DRIVING: Regarding driving, in patients with  progressive memory problems, driving will be impaired. We advise to have someone else do the driving if trouble finding directions or if minor accidents are reported. Independent driving assessment is available to  determine safety of driving.   If you are interested in the driving assessment, you can contact the following:  [employer] in [place] [phone]  [facility] [phone]  [facility] [phone] or [phone]    Mediterranean Diet A Mediterranean diet refers to food and lifestyle choices that are based on the traditions of countries located on the [employer]. This way of eating has been shown to help prevent certain conditions and improve outcomes for people who have chronic diseases, like kidney disease and heart disease. What are tips for following this plan? Lifestyle  Cook and eat meals together with your family, when possible. Drink enough fluid to keep your urine clear or pale yellow. Be physically active every day. This includes: Aerobic exercise like running or swimming. Leisure activities like gardening, walking, or housework. Get 7-8 hours of sleep each night. If recommended by your health care provider, drink red wine in moderation. This means 1 glass a day for nonpregnant women and 2 glasses a day for men. A glass of wine equals 5 oz (150 mL). Reading food labels  Check the serving size of packaged foods. For foods such as rice and pasta, the serving size refers to the amount of cooked product, not dry. Check the total fat in packaged foods. Avoid foods that have saturated fat or trans fats. Check the ingredients list for added sugars, such as corn syrup. Shopping  At the grocery store, buy most of your food from the areas near the walls of the store. This includes: Fresh fruits and vegetables (produce). Grains, beans, nuts, and seeds. Some of these may be available in unpackaged forms or large amounts (in bulk). Fresh seafood. Poultry and eggs. Low-fat dairy products. Buy whole ingredients instead of prepackaged foods. Buy fresh fruits and vegetables in-season from local  farmers markets. Buy frozen fruits and vegetables in resealable bags. If you do not have access to quality fresh seafood, buy precooked frozen shrimp or canned fish, such as tuna, salmon, or sardines. Buy small amounts of raw or cooked vegetables, salads, or olives from the deli or salad bar at your store. Stock your pantry so you always have certain foods on hand, such as olive oil, canned tuna, canned tomatoes, rice, pasta, and beans. Cooking  Cook foods with extra-virgin olive oil instead of using butter or other vegetable oils. Have meat as a side dish, and have vegetables or grains as your main dish. This means having meat in small portions or adding small amounts of meat to foods like pasta or stew. Use beans or vegetables instead of meat in common dishes like chili or lasagna. Experiment with different cooking methods. Try roasting or broiling vegetables instead of steaming or sauteing them. Add frozen vegetables to soups, stews, pasta, or rice. Add nuts or seeds for added healthy fat at each meal. You can add these to yogurt, salads, or vegetable dishes. Marinate fish or vegetables using olive oil, lemon juice, garlic, and fresh herbs. Meal planning  Plan to eat 1 vegetarian meal one day each week. Try to work up to 2 vegetarian meals, if possible. Eat seafood 2 or more times a week. Have healthy snacks readily available, such as: Vegetable sticks with hummus. Greek yogurt. Fruit and nut trail mix. Eat  balanced meals throughout the week. This includes: Fruit: 2-3 servings a day Vegetables: 4-5 servings a day Low-fat dairy: 2 servings a day Fish, poultry, or lean meat: 1 serving a day Beans and legumes: 2 or more servings a week Nuts and seeds: 1-2 servings a day Whole grains: 6-8 servings a day Extra-virgin olive oil: 3-4 servings a day Limit red meat and sweets to only a few servings a month What are my food choices? Mediterranean diet Recommended Grains: Whole-grain pasta.  Brown rice. Bulgar wheat. Polenta. Couscous. Whole-wheat bread. [place]. Vegetables: Artichokes. Beets. Broccoli. Cabbage. Carrots. Eggplant. Green beans. Chard. Kale. Spinach. Onions. Leeks. Peas. Squash. Tomatoes. Peppers. Radishes. Fruits: Apples. Apricots. Avocado. Berries. Bananas. Cherries. Dates. Figs. Grapes. Lemons. Melon. Oranges. Peaches. Plums. Pomegranate. Meats and other protein foods: Beans. Almonds. Sunflower seeds. Pine nuts. Peanuts. Cod. Salmon. Scallops. Shrimp. Tuna. Tilapia. Clams. Oysters. Eggs. Dairy: Low-fat milk. Cheese. Greek yogurt. Beverages: Water. Red wine. Herbal tea. Fats and oils: Extra virgin olive oil. Avocado oil. Grape seed oil. Sweets and desserts: [place] yogurt with honey. Baked apples. Poached pears. Trail mix. Seasoning and other foods: Basil. Cilantro. Coriander. Cumin. Mint. Parsley. Sage. Rosemary. Tarragon. Garlic. Oregano. Thyme. Pepper. Balsalmic vinegar. Tahini. Hummus. Tomato sauce. Olives. Mushrooms. Limit these Grains: Prepackaged pasta or rice dishes. Prepackaged cereal with added sugar. Vegetables: Deep fried potatoes (french fries). Fruits: Fruit canned in syrup. Meats and other protein foods: Beef. Pork. Lamb. Poultry with skin. Hot dogs. [place]. Dairy: Ice cream. Sour cream. Whole milk. Beverages: Juice. Sugar-sweetened soft drinks. Beer. Liquor and spirits. Fats and oils: Butter. Canola oil. Vegetable oil. Beef fat (tallow). Lard. Sweets and desserts: Cookies. Cakes. Pies. Candy. Seasoning and other foods: Mayonnaise. Premade sauces and marinades. The items listed may not be a complete list. Talk with your dietitian about what dietary choices are right for you. Summary The Mediterranean diet includes both food and lifestyle choices. Eat a variety of fresh fruits and vegetables, beans, nuts, seeds, and whole grains. Limit the amount of red meat and sweets that you eat. Talk with your health care provider about whether it is safe  for you to drink red wine in moderation. This means 1 glass a day for nonpregnant women and 2 glasses a day for men. A glass of wine equals 5 oz (150 mL). This information is not intended to replace advice given to you by your health care provider. Make sure you discuss any questions you have with your health care provider. Document Released: 04/13/2016 Document Revised: 05/16/2016 Document Reviewed: 04/13/2016 Elsevier Interactive Patient Education  2017 [employer].   We have sent a referral to [facility] for your MRI and they will call you directly to schedule your appointment. They are located at [address] [facility]. If you need to contact them directly please call [phone].

## 2023-02-23 NOTE — Progress Notes (Addendum)
Assessment/Plan:   Mild Cognitive Impairment of unclear etiology  Richard Harmon is a very pleasant 72 y.o. RH male with a history of mild cognitive impairment of unclear etiology presenting today in follow-up for evaluation of memory loss. Patient is on memantine 5 mg bid, tolerating well.  Patient reports subjective memory improvements "the COVID has had an out of my system", and is able to participate in his ADLs very well.  MMSE today 27/30.  He is able to drive without any difficulty.   Recommendations:   Follow up in 6 months. Continue memantine 5 mg twice daily, side effects discussed Patient is scheduled for neuropsychological evaluation for clarity of diagnosis and disease trajectory in November 2024 Recommend good control of cardiovascular risk factors Continue to control mood as per PCP    Subjective:   This patient is here alone. Previous records as well as any outside records available were reviewed prior to todays visit.   Patient was last seen on 08/24/22 with MMSE 27/30.      Any changes in memory since last visit? " I feel great after retiring". Occasionally he may forget conversations but "better than before ".  Recognizes children and grandchildren's names. He is Ship broker at [employer].  Long-term memory is good. Likes to read, walk at [employer]. Does not do a lot of brain games. Watches Jeopardy.  repeats oneself?  Endorsed, occasionally    Disoriented when walking into a room?  Patient denies   Leaving objects in unusual places?  Patient denies.   Wandering behavior?   denies   Any personality changes since last visit? denies   Any worsening depression?: Denies. Hallucinations or paranoia?  Denies.   Seizures?   denies    Any sleep changes? Sleeps well. Denies vivid dreams, REM behavior or sleepwalking   Sleep apnea? denies    Any hygiene concerns?   denies   Independent of bathing and dressing?  Endorsed  Does the patient needs help with  medications? Patient is in charge   Who is in charge of the finances?  Patient is in charge     Any changes in appetite?  denies     Patient have trouble swallowing?  denies   Does the patient cook?  Any kitchen accidents such as leaving the stove on?   denies   Any headaches?    denies   Vision changes? denies Chronic pain?  denies   Ambulates with difficulty? denies  . Walks frequently and [facility] [employer] to [facility] classes   Recent falls or head injuries?    denies      Unilateral weakness, numbness or tingling?  denies   Any tremors?  denies   Any anosmia?    denies   Any incontinence of urine?  denies   Any bowel dysfunction?  denies      Patient lives with his wife  Does the patient drive?  Endorsed, without getting lost   Neurocognitive evaluation 07/18/2022, Dr. Milbert Harmon: Briefly, results suggested primary impairments across verbal fluency (semantic worse than phonemic) and verbal learning and memory. Additional deficits were exhibited across executive functioning, particularly cognitive flexibility and abstract reasoning. Performance variability was exhibited across processing speed. Regarding etiology, there remains the possibility that his COVID-19 history could be influencing ongoing cognitive functioning. Areas commonly reported to be affected would include processing speed, executive functioning, verbal fluency, and certain aspects of memory. Across verbal memory tasks, there was some evidence to suggest rapid  forgetting and an evolving storage impairment. Retention rates of 0% (list learning task), 24% (daily living task) and 78% (story learning task) suggest ongoing variability but also overall weakness. Rapid forgetting and storage dysfunction can be an early sign of Alzheimer's disease. Impaired semantic fluency is also seen early on in this illness, as would reports by his wife via medical records that Richard Harmon is repetitive in conversation and may not have proper  insight into memory impairment. However, it is important to highlight that visual memory was strong, as was confrontation naming. While the very early stages of this illness cannot be ruled out, it also cannot be ruled in based upon the current evaluation. Continued monitoring over time will be very important.    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   How long did patient have memory difficulties?  "Before Covid I was ok, then developed brain fog until early this year, then took B12 and over the last couple of months I feel great " repeats oneself? Occasionally  Disoriented when walking into a room?  Patient denies   Leaving objects in unusual places?  Patient denies   Ambulates  with difficulty?   Patient denies. He walks 4-5 times a week   Recent falls?  Patient denies   Any head injuries?  Patient denies   History of seizures?   Patient denies   Wandering behavior?  Patient denies   Patient drives?  "  I never get lost. Patient uses GPS to drive " Any mood changes such irritability agitation?  Patient denies   Any history of depression?:  Patient denies   Hallucinations?  Patient denies   Paranoia?  Patient denies   Patient reports that he sleeps " I have always been an early riser " Denies vivid dreams, REM behavior or sleepwalking    History of sleep apnea?  Patient denies   Any hygiene concerns?  Patient denies   Independent of bathing and dressing?  Endorsed  Does the patient needs help with medications?   Patient denies   Who is in charge of the finances?  Patient is in charge   Any changes in appetite?  Patient denies. Put on some weight because of late night snacks  Patient have trouble swallowing? Patient denies   Does the patient cook?  Patient denies   Any kitchen accidents such as leaving the stove on? Patient denies   Any headaches?  Patient denies   The double vision? Patient  denies   Any focal numbness or tingling?  Patient denies   Chronic back pain Patient denies   Unilateral weakness?  Patient denies   Any tremors?  Patient denies   Any history of anosmia?  Patient denies   Any incontinence of urine?  Patient denies   Any bowel dysfunction?   Patient denies     History of heavy alcohol intake?  Patient denies   History of heavy tobacco use?  Patient denies   Family history of dementia?  Patient denies  Mo died in 04/08/1993 , she had brain multiinfarct .  Worked at [employer] retired in October 2022, shutting the program down had impact on him.  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   Testosterone deficiency in male 02/11/2019     Past Surgical History:  Procedure Laterality Date   BASAL CELL CARCINOMA EXCISION  06/2017   BICEPS TENDON REPAIR  07/05/2018   CATARACT EXTRACTION Left 10/05/2009   HERNIA REPAIR  2007   TONSILLECTOMY     TOTAL KNEE ARTHROPLASTY Left 03/04/2019   Procedure: TOTAL KNEE ARTHROPLASTY;  Surgeon: Sheral Apley, MD;  Location: [facility];  Service: Orthopedics;  Laterality: Left;     PREVIOUS MEDICATIONS:   CURRENT MEDICATIONS:  Outpatient Encounter Medications as of 02/23/2023  Medication Sig   amLODipine (NORVASC) 5 MG tablet Take 5 mg by mouth every evening.   atorvastatin (LIPITOR) 40 MG tablet Take 40 mg by mouth every evening.    Carboxymethylcellul-Glycerin (LUBRICATING EYE DROPS OP) Place 1 drop into both eyes daily as needed (irritation).   Cholecalciferol (DIALYVITE VITAMIN D 5000) 125 MCG (5000 UT) capsule Take 5,000 Units by mouth every evening.   docusate sodium (COLACE) 100 MG capsule Take 1 capsule (100 mg total) by mouth 2 (two) times daily. To prevent constipation while taking pain medication.   Krill Oil 500 MG CAPS Take 500 mg by mouth every evening.   Melatonin 10 MG CAPS Take 10 mg by mouth at bedtime.   memantine (NAMENDA) 5 MG tablet Take 1 tablet (5 mg total) by mouth 2 (two) times daily.   Nutritional Supplements (VITAMIN D BOOSTER PO) Take 2,000 mg by mouth.   omeprazole (PRILOSEC) 20 MG capsule Take 1 capsule (20 mg total) by mouth daily. 30 days for gastroprotection while taking NSAIDs.   ondansetron (ZOFRAN) 4 MG tablet Take 1 tablet (4 mg total) by mouth every 8 (eight) hours as needed for nausea or vomiting.   Testosterone 25 MG/2.5GM (1%) GEL Place 1 application onto the skin daily.   valsartan-hydrochlorothiazide (DIOVAN-HCT) 320-25 MG tablet Take 1 tablet by mouth every evening.   No facility-administered encounter medications on file as of 02/23/2023.     Objective:     PHYSICAL EXAMINATION:    VITALS:   Vitals:   02/23/23 0847  BP: 134/77  Pulse: 68  Resp: 18  SpO2: 98%  Weight: 194 lb (88 kg)  Height: 5\' 9"  (1.753 m)    GEN:  The patient appears stated age and is in NAD. HEENT:  Normocephalic, atraumatic.   Neurological examination:  General: NAD, well-groomed, appears stated age. Orientation: The patient is alert. Oriented to person, place and date Cranial nerves: There is good facial symmetry.The speech is fluent and clear. No aphasia or dysarthria. Fund of knowledge is appropriate. Recent memory impaired and remote memory is normal.  Attention and concentration are normal.  Able to name objects and repeat phrases.  Hearing is mildly decreased to conversational tone .    Delayed recall 0/3 Sensation: Sensation is intact to light touch throughout Motor: Strength is at least antigravity x4. DTR's 2/4 in UE/LE      01/18/2022    8:00 AM  Montreal Cognitive Assessment   Visuospatial/ Executive (0/5) 4  Naming (0/3) 2  Attention: Read list of  digits (0/2) 2  Attention: Read list of letters (0/1) 1  Attention: Serial 7 subtraction starting at 100 (0/3) 3  Language: Repeat phrase (0/2) 2  Language : Fluency (0/1) 0  Abstraction (0/2) 2  Delayed Recall (0/5) 0  Orientation (0/6) 5  Total 21       02/23/2023   10:00 AM 08/24/2022    9:00 AM  MMSE - Mini Mental State Exam  Orientation to time 5 4  Orientation to Place 5 5  Registration 3 3  Attention/ Calculation 5 5  Recall 0 1  Language- name 2 objects 2 2  Language- repeat 1 1  Language- follow 3 step command 3 3  Language- read & follow direction 1 1  Write a sentence 1 1  Copy design 1 1  Total score 27 27       Movement examination: Tone: There is normal tone in the UE/LE Abnormal movements:  no tremor.  No myoclonus.  No asterixis.   Coordination:  There is no decremation with RAM's. Normal finger to nose  Gait and Station: The patient has no difficulty arising out of a deep-seated chair without the use of the hands. The patient's stride length is good.  Gait is cautious and narrow.   Thank you for allowing [place] the opportunity to participate in the care of this nice patient. Please do not hesitate to contact us for any questions or concerns.   Total time spent on today's visit was 21 minutes dedicated to this patient today, preparing to see patient, examining the patient, ordering tests and/or medications and counseling the patient, documenting clinical information in the EHR or other health record, independently interpreting results and communicating results to the patient/family, discussing treatment and goals, answering patient's questions and coordinating care.  Cc:  Farris Has,  MD  Marlowe Kays 02/23/2023 10:36 AM

## 2023-03-30 DIAGNOSIS — H52223 Regular astigmatism, bilateral: Secondary | ICD-10-CM | POA: Diagnosis not present

## 2023-04-13 NOTE — Progress Notes (Signed)
Cardiology Office Note:   Date:  04/20/2023  ID:  Richard Harmon, DOB 1950/12/12, MRN [id number] PCP:  Farris Has, MD  [facility] Providers Cardiologist:  Alverda Skeans, MD Referring MD: Farris Has, MD   Chief Complaint/Reason for Referral:  Murmur ASSESSMENT:    1. Murmur   2. Essential hypertension   3. Hyperlipidemia, unspecified hyperlipidemia type     PLAN:   In order of problems listed above:  Murmur:  Will obtain TTE to evaluate further. HTN: Increase amlodipine to 10mg . Hyperlipidemia:  Check lipids/LFTs/Lp(a) today.             Dispo:  Return in about 6 months (around 10/21/2023).      Medication Adjustments/Labs and Tests Ordered: Current medicines are reviewed at length with the patient today.  Concerns regarding medicines are outlined above.  The following changes have been made:     Labs/tests ordered: Orders Placed This Encounter  Procedures   Lipid panel   Hepatic function panel   Lipoprotein A (LPA)   EKG 12-Lead   ECHOCARDIOGRAM COMPLETE    Medication Changes: Meds ordered this encounter  Medications   amLODipine (NORVASC) 10 MG tablet    Sig: Take 1 tablet (10 mg total) by mouth daily.    Dispense:  90 tablet    Refill:  3    Current medicines are reviewed at length with the patient today.  The patient does not have concerns regarding medicines.  History of Present Illness:   FOCUSED PROBLEM LIST:   Hyperlipidemia Bifascicular block (RBBB + LAFB) Hypertension BMI 28 Mild cognitive impairment  The patient is a 72 y.o. male with the indicated medical history here for recommendations regarding an incidental murmur.  The patient was seen by his PCP recently and was doing fairly well.  A murmur was appreciated at the LUSB.  The patient was then referred to cardiology.  The patient has been retired for 2 years.  He is very active.  He walks on a daily basis.  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  He denies any  dyspnea, exertional discomfort, presyncope, syncope, palpitations, paroxysmal atrial dyspnea, orthopnea.  He is required no emergency room visits or hospitalizations.  He does not smoke or drink.  He is otherwise well and without significant complaints.          Current Medications: Current Meds  Medication Sig   amLODipine (NORVASC) 10 MG tablet Take 1 tablet (10 mg total) by mouth daily.   atorvastatin (LIPITOR) 40 MG tablet Take 40 mg by mouth every evening.   cyanocobalamin (VITAMIN B12) 1000 MCG tablet Take 1,000 mcg by mouth daily.   Krill Oil 500 MG CAPS Take 500 mg by mouth every evening.   Melatonin 10 MG CAPS Take 10 mg by mouth at bedtime.   memantine (NAMENDA) 5 MG tablet Take 1 tablet (5 mg total) by mouth 2 (two) times daily.   Nutritional Supplements (VITAMIN D BOOSTER PO) Take 2,000 mg by mouth.   Testosterone 25 MG/2.5GM (1%) GEL Place 1 application onto the skin daily.   valsartan-hydrochlorothiazide (DIOVAN-HCT) 320-25 MG tablet Take 1 tablet by mouth every evening.   [DISCONTINUED] amLODipine (NORVASC) 5 MG tablet Take 5 mg by mouth every evening.   [DISCONTINUED] Carboxymethylcellul-Glycerin (LUBRICATING EYE DROPS OP) Place 1 drop into both eyes daily as needed (irritation).   [DISCONTINUED] Cholecalciferol (DIALYVITE VITAMIN D 5000) 125 MCG (5000 UT) capsule Take 5,000 Units by mouth every evening.   [DISCONTINUED] omeprazole (  PRILOSEC) 20 MG capsule Take 1 capsule (20 mg total) by mouth daily. 30 days for gastroprotection while taking NSAIDs.     Allergies:    Patient has no known allergies.   Social History:   Social History   Tobacco Use   Smoking status: Former    Current packs/day: 0.25    Average packs/day: 0.3 packs/day for 4.0 years (1.0 ttl pk-yrs)    Types: Cigarettes   Smokeless tobacco: Never   Tobacco comments:    Barrister's clerk Use   Vaping status: Never Used  Substance Use Topics   Alcohol use: Not Currently    Comment: Rare   Drug  use: Not Currently     Family Hx: Family History  Problem Relation Age of Onset   Mental illness Mother        "mental breakdown"     Review of Systems:   Please see the history of present illness.    All other systems reviewed and are negative.     EKGs/Labs/Other Test Reviewed:   EKG:    EKG Interpretation Date/Time:  Friday April 20 2023 07:51:26 EDT Ventricular Rate:  62 PR Interval:  140 QRS Duration:  132 QT Interval:  446 QTC Calculation: 452 R Axis:   -26  Text Interpretation: Normal sinus rhythm Right bundle branch block No previous ECGs available Confirmed by Alverda Skeans ([id number]) on 04/20/2023 7:52:48 AM        Prior CV studies reviewed:     Recent Labs: No results found for requested labs within last 365 days.   Lipid Panel No results found for: "CHOL", "TRIG", "HDL", "CHOLHDL", "VLDL", "LDLCALC", "LDLDIRECT"  Risk Assessment/Calculations:          Physical Exam:   VS:  BP (!) 150/90   Pulse 62   Ht 5\' 9"  (1.753 m)   Wt 199 lb 6.4 oz (90.4 kg)   SpO2 97%   BMI 29.45 kg/m    HYPERTENSION CONTROL Vitals:   04/20/23 0754 04/20/23 0805  BP: (!) 144/86 (!) 150/90    The patient's blood pressure is elevated above target today.  In order to address the patient's elevated BP: A current anti-hypertensive medication was adjusted today.       Wt Readings from Last 3 Encounters:  04/20/23 199 lb 6.4 oz (90.4 kg)  02/23/23 194 lb (88 kg)  08/24/22 189 lb (85.7 kg)      GENERAL:  No apparent distress, AOx3 HEENT:  No carotid bruits, +2 carotid impulses, no scleral icterus CAR: RRR no murmurs, gallops, rubs, or thrills RES:  Clear to auscultation bilaterally ABD:  Soft, nontender, nondistended, positive bowel sounds x 4 VASC:  +2 radial pulses, +2 carotid pulses NEURO:  CN 2-12 grossly intact; motor and sensory grossly intact PSYCH:  No active depression or anxiety EXT:  No edema, ecchymosis, or cyanosis  Signed, Orbie Pyo, MD   04/20/2023 8:46 AM    [facility] [address] [place], [place], [place]  [address] Phone: [phone]; Fax: [phone]   Note:  This document was prepared using Dragon voice recognition software and may include unintentional dictation errors.

## 2023-04-20 ENCOUNTER — Ambulatory Visit: Payer: Medicare PPO | Admitting: Internal Medicine

## 2023-04-20 ENCOUNTER — Encounter: Payer: Self-pay | Admitting: Internal Medicine

## 2023-04-20 VITALS — BP 150/90 | HR 62 | Ht 69.0 in | Wt 199.4 lb

## 2023-04-20 DIAGNOSIS — I10 Essential (primary) hypertension: Secondary | ICD-10-CM

## 2023-04-20 DIAGNOSIS — R011 Cardiac murmur, unspecified: Secondary | ICD-10-CM | POA: Diagnosis not present

## 2023-04-20 DIAGNOSIS — E785 Hyperlipidemia, unspecified: Secondary | ICD-10-CM | POA: Diagnosis not present

## 2023-04-20 MED ORDER — AMLODIPINE BESYLATE 10 MG PO TABS: 10.0000 mg | ORAL_TABLET | Freq: Every day | ORAL | 3 refills | Status: DC

## 2023-04-20 NOTE — Patient Instructions (Signed)
Medication Instructions:  Your physician has recommended you make the following change in your medication:  1.) increase amlodipine to 10 mg - one tablet daily  *If you need a refill on your cardiac medications before your next appointment, please call your pharmacy*   Lab Work: Today: lipids, liver, Lp(a)  If you have labs (blood work) drawn today and your tests are completely normal, you will receive your results only by: MyChart Message (if you have MyChart) OR A paper copy in the mail If you have any lab test that is abnormal or we need to change your treatment, we will call you to review the results.   Testing/Procedures: Your physician has requested that you have an echocardiogram. Echocardiography is a painless test that uses sound waves to create images of your heart. It provides your doctor with information about the size and shape of your heart and how well your heart's chambers and valves are working. This procedure takes approximately one hour. There are no restrictions for this procedure. Please do NOT wear cologne, perfume, aftershave, or lotions (deodorant is allowed). Please arrive 15 minutes prior to your appointment time.    Follow-Up: At [facility], you and your health needs are our priority.  As part of our continuing mission to provide you with exceptional heart care, we have created designated Provider Care Teams.  These Care Teams include your primary Cardiologist (physician) and Advanced Practice Providers (APPs -  Physician Assistants and Nurse Practitioners) who all work together to provide you with the care you need, when you need it.   Your next appointment:   6 month(s)  Provider:   Alverda Skeans, MD or NP or PA-C

## 2023-04-24 LAB — LIPOPROTEIN A (LPA): Lipoprotein (a): 20.8 nmol/L (ref <75.0)

## 2023-04-24 LAB — HEPATIC FUNCTION PANEL
ALT: 23 IU/L (ref 0–44)
AST: 24 IU/L (ref 0–40)
Albumin: 4.4 g/dL (ref 3.8–4.8)
Alkaline Phosphatase: 72 IU/L (ref 44–121)
Bilirubin Total: 0.5 mg/dL (ref 0.0–1.2)
Bilirubin, Direct: 0.16 mg/dL (ref 0.00–0.40)
Total Protein: 6.4 g/dL (ref 6.0–8.5)

## 2023-04-24 LAB — LIPID PANEL
Chol/HDL Ratio: 2.6 ratio (ref 0.0–5.0)
Cholesterol, Total: 112 mg/dL (ref 100–199)
HDL: 43 mg/dL (ref >39)
LDL Chol Calc (NIH): 47 mg/dL (ref 0–99)
Triglycerides: 122 mg/dL (ref 0–149)
VLDL Cholesterol Cal: 22 mg/dL (ref 5–40)

## 2023-05-14 ENCOUNTER — Ambulatory Visit (HOSPITAL_COMMUNITY): Payer: Medicare PPO | Attending: Internal Medicine

## 2023-05-14 DIAGNOSIS — I10 Essential (primary) hypertension: Secondary | ICD-10-CM | POA: Diagnosis not present

## 2023-05-14 LAB — ECHOCARDIOGRAM COMPLETE
Area-P 1/2: 2.82 cm2
S' Lateral: 2.9 cm

## 2023-05-16 DIAGNOSIS — E291 Testicular hypofunction: Secondary | ICD-10-CM | POA: Diagnosis not present

## 2023-05-16 DIAGNOSIS — R948 Abnormal results of function studies of other organs and systems: Secondary | ICD-10-CM | POA: Diagnosis not present

## 2023-05-16 DIAGNOSIS — R351 Nocturia: Secondary | ICD-10-CM | POA: Diagnosis not present

## 2023-05-21 ENCOUNTER — Other Ambulatory Visit: Payer: Self-pay | Admitting: *Deleted

## 2023-05-21 DIAGNOSIS — I77819 Aortic ectasia, unspecified site: Secondary | ICD-10-CM

## 2023-05-21 NOTE — Progress Notes (Signed)
Per Dr. Lynnette Caffey:  Aortic dilatation on echo.  Get CT chest aorta to eval further.  Will need bmet prior.  Spoke w patient and informed.

## 2023-06-12 ENCOUNTER — Ambulatory Visit: Payer: Medicare PPO | Attending: Internal Medicine

## 2023-06-12 DIAGNOSIS — I77819 Aortic ectasia, unspecified site: Secondary | ICD-10-CM | POA: Diagnosis not present

## 2023-06-13 LAB — BASIC METABOLIC PANEL
BUN/Creatinine Ratio: 18 (ref 10–24)
BUN: 14 mg/dL (ref 8–27)
CO2: 24 mmol/L (ref 20–29)
Calcium: 9.4 mg/dL (ref 8.6–10.2)
Chloride: 102 mmol/L (ref 96–106)
Creatinine, Ser: 0.77 mg/dL (ref 0.76–1.27)
Glucose: 123 mg/dL — ABNORMAL HIGH (ref 70–99)
Potassium: 4.5 mmol/L (ref 3.5–5.2)
Sodium: 143 mmol/L (ref 134–144)
eGFR: 94 mL/min/{1.73_m2} (ref >59)

## 2023-06-19 ENCOUNTER — Ambulatory Visit (HOSPITAL_BASED_OUTPATIENT_CLINIC_OR_DEPARTMENT_OTHER): Payer: Medicare PPO

## 2023-06-24 ENCOUNTER — Ambulatory Visit (HOSPITAL_BASED_OUTPATIENT_CLINIC_OR_DEPARTMENT_OTHER): Payer: Medicare PPO

## 2023-07-02 ENCOUNTER — Other Ambulatory Visit (HOSPITAL_COMMUNITY): Payer: Medicare PPO

## 2023-07-12 ENCOUNTER — Ambulatory Visit (HOSPITAL_BASED_OUTPATIENT_CLINIC_OR_DEPARTMENT_OTHER)
Admission: RE | Admit: 2023-07-12 | Discharge: 2023-07-12 | Disposition: A | Discharge status: Home / Self Care | Payer: Medicare PPO | Source: Ambulatory Visit | Attending: Internal Medicine | Admitting: Internal Medicine

## 2023-07-12 DIAGNOSIS — K449 Diaphragmatic hernia without obstruction or gangrene: Secondary | ICD-10-CM | POA: Diagnosis not present

## 2023-07-12 DIAGNOSIS — I77819 Aortic ectasia, unspecified site: Secondary | ICD-10-CM | POA: Insufficient documentation

## 2023-07-12 DIAGNOSIS — I719 Aortic aneurysm of unspecified site, without rupture: Secondary | ICD-10-CM | POA: Diagnosis not present

## 2023-07-12 DIAGNOSIS — J9811 Atelectasis: Secondary | ICD-10-CM | POA: Diagnosis not present

## 2023-07-12 DIAGNOSIS — I2510 Atherosclerotic heart disease of native coronary artery without angina pectoris: Secondary | ICD-10-CM | POA: Diagnosis not present

## 2023-07-12 MED ORDER — IOHEXOL 350 MG/ML SOLN
100.0000 mL | Freq: Once | INTRAVENOUS | Status: AC | PRN
  Administered 2023-07-12: 75 mL via INTRAVENOUS

## 2023-07-16 DIAGNOSIS — L570 Actinic keratosis: Secondary | ICD-10-CM | POA: Diagnosis not present

## 2023-07-19 ENCOUNTER — Encounter: Payer: Self-pay | Admitting: Psychology

## 2023-07-20 ENCOUNTER — Encounter: Payer: Self-pay | Admitting: Psychology

## 2023-07-20 ENCOUNTER — Ambulatory Visit: Payer: Medicare PPO | Admitting: Psychology

## 2023-07-20 DIAGNOSIS — R4189 Other symptoms and signs involving cognitive functions and awareness: Secondary | ICD-10-CM

## 2023-07-20 DIAGNOSIS — G309 Alzheimer's disease, unspecified: Secondary | ICD-10-CM

## 2023-07-20 DIAGNOSIS — G3184 Mild cognitive impairment, so stated: Secondary | ICD-10-CM | POA: Diagnosis not present

## 2023-07-20 HISTORY — DX: Mild cognitive impairment of uncertain or unknown etiology: G31.84

## 2023-07-20 HISTORY — DX: Alzheimer's disease, unspecified: G30.9

## 2023-07-20 NOTE — Progress Notes (Signed)
   Psychometrician Note   Cognitive testing was administered to Richard Harmon by Wallace Keller, B.S. (psychometrist) under the supervision of Dr. Newman Nickels, Ph.D., licensed psychologist on 07/20/2023. Richard Harmon did not appear overtly distressed by the testing session per behavioral observation or responses across self-report questionnaires. Rest breaks were offered.    The battery of tests administered was selected by Dr. Newman Nickels, Ph.D. with consideration to Richard Harmon, the nature of his symptoms, emotional and behavioral responses during interview, level of literacy, observed level of motivation/effort, and the nature of the referral question. This battery was communicated to the psychometrist. Communication between Dr. Newman Nickels, Ph.D. and the psychometrist was ongoing throughout the evaluation and Dr. Newman Nickels, Ph.D. was immediately accessible at all times. Dr. Newman Nickels, Ph.D. provided supervision to the psychometrist on the date of this service to the extent necessary to assure the quality of all services provided.    Richard Harmon will return within approximately 1-2 weeks for an interactive feedback session with Dr. Milbert Coulter at which time his test performances, clinical impressions, and treatment recommendations will be reviewed in detail. Richard Harmon he can contact our office should he require our assistance before this time.  A total of 140 minutes of billable time were spent face-to-face with Richard Harmon by the psychometrist. This includes both test administration and scoring time. Billing for these services is reflected in the clinical report generated by Dr. Newman Nickels, Ph.D.  This note reflects time spent with the psychometrician and does not include test scores or any clinical interpretations made by Dr. Milbert Coulter. The full report will follow in a separate note.

## 2023-07-20 NOTE — Progress Notes (Signed)
NEUROPSYCHOLOGICAL EVALUATION [name] Department of Neurology  Date of Evaluation: July 20, 2023  Reason for Referral:   Richard Harmon is a 72 y.o. right-handed Caucasian male referred by Marlowe Kays, PA-C, to characterize his current cognitive functioning and assist with diagnostic clarity and treatment planning in the context of a prior mild neurocognitive disorder diagnosis and concerns for progressive decline over time.   Assessment and Plan:   Clinical Impression(s): Richard Harmon pattern of performance is suggestive of severe impairment surrounding verbal fluency (both phonemic and semantic), as well as all aspects of verbal learning and memory. Further weakness was exhibited across executive functioning and both delayed retrieval and recognition/consolidation aspects of visual memory. Performance variability was exhibited across processing speed. Performances were appropriate relative to age-matched peers across attention/concentration, safety/judgment, receptive language, confrontation naming, and visuospatial abilities. Richard Harmon denied difficulties completing instrumental activities of daily living (ADLs) independently. 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  Relative to his previous evaluation in November 2023, mild to moderate decline was exhibited across verbal fluency and all aspects of memory, both verbal and visual. More subtle decline was further exhibited across processing speed and executive functioning. Stability was exhibited across other assessed domains.  Regarding the cause of severe memory impairment and other areas of cognitive dysfunction, I continue to have concerns surrounding underlying Alzheimer's disease. Across memory testing, he had significant trouble learning  novel information efficiently, exhibited severe impairments recalling previously learned information after a brief delay, and consistently performed poorly across yes/no recognition trials. Verbal memory did exhibit greater dysfunction than visual memory, with verbal memory retention rates including 0%, 6%, and 43% across list, daily living, and story based tasks respectively. Taken together, this suggests rapid forgetting and a prominent storage impairment, both of which are the hallmark testing characteristics of this illness. Similar patterns were exhibited during his 2023 evaluation. However, evidence for memory decline over time further elevates concerns for an underlying neurodegenerative process such as Alzheimer's disease. Intact confrontation naming continues to be encouraging. However, this would not preclude Alzheimer's disease from being present. Continued medical monitoring will be important moving forward.  Despite Richard Harmon expressing his belief that symptoms were attributed to a prior (2022) mild COVID-19 infection with no medical complications, this would be extremely unlikely given testing patterns and current objective evidence for neurodegeneration over time.   Recommendations: A repeat neuropsychological evaluation in 12-18 months could be considered to continue tracking progression over time.  Richard Harmon has already been prescribed a medication aimed to address memory loss and concerns surrounding Alzheimer's disease (i.e., memantine/Namenda). He is encouraged to continue taking this medication as prescribed. It is important to highlight that this medication has been shown to slow functional decline in some individuals. There is no current treatment which can stop or reverse cognitive decline when caused by a neurodegenerative illness.   Performance across neurocognitive testing is not a strong predictor of an individual's safety operating a motor vehicle. Should his family wish to  pursue a formalized driving evaluation, they could reach out to the following agencies: [employer] in [place]: [phone] [facility]: [phone] [facility]: [phone] [facility]: [phone] or [phone]  Should there be progression of current deficits over time, Richard Harmon is unlikely to regain any independent living skills lost. Therefore, it is recommended that he remain  as involved as possible in all aspects of household chores, finances, and medication management, with supervision to ensure adequate performance. He will likely benefit from the establishment and maintenance of a routine in order to maximize his functional abilities over time.  It will be important for Richard Harmon to have another person with him when in situations where he may need to process information, weigh the pros and cons of different options, and make decisions, in order to ensure that he fully understands and recalls all information to be considered.  If not already done, Richard Harmon and his family may want to discuss his wishes regarding durable power of attorney and medical decision making, so that he can have input into these choices. If they require legal assistance with this, long-term care resource access, or other aspects of estate planning, they could reach out to [employer] at [phone] for a free consultation. Additionally, they may wish to discuss future plans for caretaking and seek out community options for in home/residential care should they become necessary.  Richard Harmon is encouraged to attend to lifestyle factors for brain health (e.g., regular physical exercise, good nutrition habits and consideration of the MIND-DASH diet, regular participation in cognitively-stimulating activities, and general stress management techniques), which are likely to have benefits for both emotional adjustment and cognition. Optimal control of vascular  risk factors (including safe cardiovascular exercise and adherence to dietary recommendations) is encouraged. Continued participation in activities which provide mental stimulation and social interaction is also recommended.   Important information should be provided to Mr. Bezio in written format in all instances. This information should be placed in a highly frequented and easily visible location within his home to promote recall. External strategies such as written notes in a consistently used memory journal, visual and nonverbal auditory cues such as a calendar on the refrigerator or appointments with alarm, such as on a cell phone, can also help maximize recall.  To address problems with processing speed, he may wish to consider:   -Ensuring that he is alerted when essential material or instructions are being presented   -Adjusting the speed at which new information is presented   -Allowing for more time in comprehending, processing, and responding in conversation   -Repeating and paraphrasing instructions or conversations aloud  To address problems with fluctuating attention and/or executive dysfunction, he may wish to consider:   -Avoiding external distractions when needing to concentrate   -Limiting exposure to fast paced environments with multiple sensory demands   -Writing down complicated information and using checklists   -Attempting and completing one task at a time (i.e., no multi-tasking)   -Verbalizing aloud each step of a task to maintain focus   -Taking frequent breaks during the completion of steps/tasks to avoid fatigue   -Reducing the amount of information considered at one time   -Scheduling more difficult activities for a time of day where he is usually most alert  Review of Records:   Mr. Regis was seen by [facility] Marlowe Kays, PA-C) on 01/18/2022 for an evaluation of memory loss. Briefly, Mr. Sivers reported contracting COVID-19 in the summer/fall of  2022. There was no reported hospitalization, low O2 values, or required ventilation or oxygen support. He reported persisting symptoms surrounding brain fog and diminished memory after his recovery. At the time he met with Ms. Wertman, he reported the full amelioration of his symptoms. He also noted starting B12 supplementation which was said to be beneficial. However, performance on a brief  cognitive screening instrument (MOCA) was 21/30, including a 0/5 performance across delayed recall. Ultimately, Mr. Pitsenbarger was referred for a comprehensive neuropsychological evaluation to characterize his cognitive abilities and to assist with diagnostic clarity and treatment planning.    Mr. Lynds followed-up with Ms. Wertman on 02/22/2022. Symptoms were said to be stable. However, his wife did remark that Mr. Kenner will make repetitive statements or ask repetitive questions and that "He may not realize this, but I Alexis Goodell noticed] this for quite some time."  He completed a comprehensive neuropsychological evaluation with myself on 07/11/2022. Results suggested primary impairments across verbal fluency (semantic worse than phonemic) and verbal learning and memory. Additional deficits were exhibited across executive functioning, particularly cognitive flexibility and abstract reasoning. Performance variability was exhibited across processing speed. Performances were appropriate relative to age-matched peers across attention/concentration, safety/judgment, receptive language, confrontation naming, visuospatial abilities, and visual learning and memory. He denied ADL dysfunction and was diagnosed with a mild neurocognitive disorder. The underlying etiology was uncertain at that time and repeat testing in 12-18 months was recommended.  He most recently met with Ms. Wertman for follow-up on 02/23/2023. He reported stability if not his perception of improvement regarding memory and other cognitive abilities, attributing prior  difficulties to a long COVID experience. ADL dysfunction was denied. Performance on a brief cognitive screening instrument (MMSE) was 27/30. Ultimately, Mr. Shamblen was referred for a comprehensive neuropsychological evaluation to characterize his cognitive abilities and to assist with diagnostic clarity and treatment planning.   Neuroimaging Brain MRI on 02/08/2022 revealed mild parenchymal volume loss and minimal microvascular ischemic changes.  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   Testosterone deficiency in male 02/11/2019    Past Surgical History:  Procedure Laterality Date   BASAL CELL CARCINOMA EXCISION  06/2017   BICEPS TENDON REPAIR  07/05/2018   CATARACT EXTRACTION Left 10/05/2009   HERNIA REPAIR  2007   TONSILLECTOMY     TOTAL KNEE ARTHROPLASTY Left 03/04/2019   Procedure: TOTAL KNEE ARTHROPLASTY;  Surgeon: Sheral Apley, MD;  Location: [facility];  Service: Orthopedics;  Laterality: Left;    Current Outpatient Medications:    amLODipine (NORVASC) 10 MG tablet, Take 1 tablet (10 mg total) by mouth daily., Disp: 90 tablet, Rfl: 3   atorvastatin (LIPITOR) 40 MG tablet, Take 40 mg by mouth every evening., Disp: , Rfl:    cyanocobalamin (VITAMIN B12) 1000 MCG tablet, Take 1,000 mcg by mouth daily., Disp: , Rfl:    Krill Oil 500 MG CAPS, Take 500 mg by mouth every evening., Disp: , Rfl:    Melatonin 10 MG CAPS, Take 10 mg by mouth at bedtime., Disp: , Rfl:    memantine (NAMENDA) 5 MG tablet, Take 1 tablet (5 mg total) by mouth 2 (two) times  daily., Disp: 180 tablet, Rfl: 4   Nutritional Supplements (VITAMIN D BOOSTER PO), Take 2,000 mg by mouth., Disp: , Rfl:    Testosterone 25 MG/2.5GM (1%) GEL, Place 1 application onto the skin daily., Disp: , Rfl:    valsartan-hydrochlorothiazide (DIOVAN-HCT) 320-25  MG tablet, Take 1 tablet by mouth every evening., Disp: , Rfl:   Clinical Interview:   The following information was obtained during a clinical interview with Mr. Willits prior to cognitive testing.  Cognitive Symptoms: Decreased short-term memory: Denied. Decreased long-term memory: Denied. Decreased attention/concentration: Denied. Reduced processing speed: Denied. Difficulties with executive functions: Denied. He also denied impulsivity or any significant personality changes.  Difficulties with emotion regulation: Denied. Difficulties with receptive language: Denied. Difficulties with word finding: Denied. Decreased visuoperceptual ability: Denied.   Trajectory of deficits: He reported contracting COVID-19 in August 2022. No difficulties were said to be present prior to this event. During and after his illness, he reported symptoms particularly focusing on reduced processing speed, brain fog, and short-term memory dysfunction. Currently, he described a full resolution of symptoms, emphasizing that he feels improved mentally relative to his previous evaluation. His wife was not present to corroborate this statement.    Difficulties completing ADLs: Denied.  Additional Medical History: History of traumatic brain injury/concussion: Denied. History of stroke: Denied. History of seizure activity: Denied. History of known exposure to toxins: Denied. Symptoms of chronic pain: Denied. Experience of frequent headaches/migraines: Denied. Frequent instances of dizziness/vertigo: Denied.   Sensory changes: He reported ongoing hearing loss and utilizes hearing aids with benefit. Other sensory changes/difficulties (e.g., vision,  taste, smell) were denied. Balance/coordination difficulties: Denied. He also denied any recent falls.  Other motor difficulties: Denied.  Sleep History: Estimated hours obtained each night: 7-8 hours.  Difficulties falling asleep: Denied. Difficulties staying asleep: Denied outside of waking to use the restroom.  Feels rested and refreshed upon awakening: Endorsed "more or less." He did acknowledge occasional napping during the day.   History of snoring: Endorsed "sometimes." History of waking up gasping for air: Denied. Witnessed breath cessation while asleep: Denied.   History of vivid dreaming: Denied. Excessive movement while asleep: Denied. Instances of acting out his dreams: Denied.  Psychiatric/Behavioral Health History: Depression: Denied. He described his current mood as "fine" and denied to his knowledge any previous mental health concerns or formal diagnoses. Current or remote suicidal ideation, intent, or plan was denied.  Anxiety: Denied. Mania: Denied. Trauma History: Denied. Visual/auditory hallucinations: Denied. Delusional thoughts: Denied.   Tobacco: Denied. Alcohol: He denied current alcohol consumption as well as a history of problematic alcohol abuse or dependence.  Recreational drugs: Denied.  Family History: Problem Relation Age of Onset   Mental illness Mother        "mental breakdown"   This information was confirmed by Mr. Frediani.  Academic/Vocational History: Highest level of educational attainment: 18 years. He earned an Set designer and described himself as a good (A/B) student in academic settings. No relative weaknesses were identified.  History of developmental delay: Denied. History of grade repetition: Denied. Enrollment in special education courses: Denied. History of LD/ADHD: Denied.   Employment: Semi-retired. 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 He reported no difficulties performing the requirements of this position.   Evaluation Results:   Behavioral Observations: Mr. Cera was unaccompanied, arrived to his appointment on time, and was appropriately dressed and groomed. He appeared alert and oriented. Observed gait and station were within normal limits. Gross motor functioning appeared intact upon informal observation and no abnormal movements (e.g., tremors) were noted. His affect was generally relaxed and positive. Spontaneous speech was fluent and word finding difficulties were not  observed during the clinical interview. Thought processes were coherent, organized, and normal in content. Insight into his cognitive difficulties appeared poor in that he denied all impairment and reported subjective beliefs surrounding improved performances despite continued and fairly severe memory impairment across testing.   During testing, sustained attention was appropriate. Task engagement was adequate and he persisted when challenged. Overall, Mr. Victorino was cooperative with the clinical interview and subsequent testing procedures.   Adequacy of Effort: The validity of neuropsychological testing is limited by the extent to which the individual being tested may be assumed to have exerted adequate effort during testing. Mr. Mathes expressed his intention to perform to the best of his abilities and exhibited adequate task engagement and persistence. Scores across stand-alone and embedded performance validity measures were within expectation. As such, the results of the current evaluation are believed to be a valid representation of Mr. Damme current cognitive functioning.  Test Results: Mr. Shupe was largely oriented at the time of the current evaluation. He did incorrectly state the current year ("2004").   Intellectual abilities based upon educational and vocational attainment were estimated to be in  the average to above average range. Premorbid abilities were estimated to be within the above average range based upon a single-word reading test.   Processing speed was variable, ranging from the exceptionally low to average normative ranges. Basic attention was well above average. More complex attention (e.g., working memory) was average. Executive functioning was variable but overall below expectation, ranging from the exceptionally low to below average normative ranges. He did perform in the exceptionally high range across a task assessing safety and judgment.  Assessed receptive language abilities were average. Likewise, Mr. Marcille did not exhibit any difficulties comprehending task instructions and answered all questions asked of him appropriately. Assessed expressive language was variable. Phonemic fluency (both phonemic and semantic) were exceptionally low while confrontation naming was average.     Assessed visuospatial/visuoconstructional abilities were average to above average.    Learning (i.e., encoding) of novel verbal and visual information was variable, ranging from the exceptionally low to average normative ranges. Spontaneous delayed recall (i.e., retrieval) of previously learned information was exceptionally low to below average. Retention rates were 43% across a story learning task, 0% across a list learning task, 6% across a daily living task, and 67% across a shape learning task. Performance across recognition tasks was exceptionally low to well below average, suggesting negligible evidence for information consolidation.   Results of emotional screening instruments suggested that recent symptoms of generalized anxiety were in the minimal range, while symptoms of depression were within normal limits. A screening instrument assessing recent sleep quality suggested the presence of minimal sleep dysfunction.  Tables of Scores:   Note: This summary of test scores accompanies the  interpretive report and should not be considered in isolation without reference to the appropriate sections in the text. Descriptors are based on appropriate normative data and may be adjusted based on clinical judgment. Terms such as "Within Normal Limits" and "Outside Normal Limits" are used when a more specific description of the test score cannot be determined. Descriptors refer to the current evaluation only.        Percentile - Normative Descriptor > 98 - Exceptionally High 91-97 - Well Above Average 75-90 - Above Average 25-74 - Average 9-24 - Below Average 2-8 - Well Below Average < 2 - Exceptionally Low        Validity: November 2023 Current  DESCRIPTOR  DCT: --- --- --- Within Normal Limits  NAB EVI: --- --- --- Within Normal Limits        Orientation:       Raw Score Raw Score Percentile   NAB Orientation, Form 1 28/29 27/29 --- ---        Cognitive Screening:       Raw Score Raw Score Percentile   SLUMS: 21/30 18/30 --- ---        Intellectual Functioning:       Standard Score Standard Score Percentile   Test of Premorbid Functioning: 121 119 90 Above Average        Memory:      NAB Memory Module, Form 1: Standard Score/ T Score Standard Score/ T Score Percentile   Total Memory Index 70 61 <1 Exceptionally Low  List Learning        Total Trials 1-3 15/36 (35) 9/36 (19) <1 Exceptionally Low    List B 2/12 (34) 4/12 (48) 42 Average    Short Delay Free Recall 0/12 (19) 0/12 (19) <1 Exceptionally Low    Long Delay Free Recall 0/12 (23) 0/12 (23) <1 Exceptionally Low    Retention Percentage 0 (22) 0 (22) <1 Exceptionally Low    Recognition Discriminability 4 (42) -3 (21) <1 Exceptionally Low  Shape Learning        Total Trials 1-3 17/27 (55) 14/27 (46) 34 Average    Delayed Recall 5/9 (46) 4/9 (40) 16 Below Average    Retention Percentage 63 (39) 67 (40) 16 Below Average    Recognition Discriminability 7 (52) 4 (36) 8 Well Below Average  Story Learning         Immediate Recall 43/80 (34) 31/80 (26) 1 Exceptionally Low    Delayed Recall 18/40 (32) 9/40 (30) 2 Well Below Average    Retention Percentage 78 (44) 43 (30) 2 Well Below Average  Daily Living Memory        Immediate Recall 43/51 (52) 36/51 (39) 14 Below Average    Delayed Recall 4/17 (19) 1/17 (19) <1 Exceptionally Low    Retention Percentage 24 (<19) 6 (<19) <1 Exceptionally Low    Recognition Hits 7/10 (36) 6/10 (28) 2 Well Below Average        Attention/Executive Function:      Trail Making Test (TMT): Raw Score (T Score) Raw Score (T Score) Percentile     Part A 52 secs.,  1 error (37) 44 secs.,  1 error (41) 18 Below Average    Part B 132 secs.,  0 errors (35) 158 secs.,  4 errors (31) 3 Well Below Average          Scaled Score Scaled Score Percentile   WAIS-IV Coding: 9 6 9  Below Average        NAB Attention Module, Form 1: T Score T Score Percentile     Digits Forward 49 69 97 Well Above Average    Digits Backwards 58 50 50 Average         Scaled Score Scaled Score Percentile   WAIS-IV Similarities: 5 6 9  Below Average        D-KEFS Color-Word Interference Test: Raw Score (Scaled Score) Raw Score (Scaled Score) Percentile     Color Naming 46 secs. (5) 57 secs. (1) <1 Exceptionally Low    Word Reading 21 secs. (12) 24 secs. (11) 63 Average    Inhibition 93 secs. (7) 126 secs. (2) <1 Exceptionally Low  Total Errors 1 error (12) 0 errors (13) 84 Above Average    Inhibition/Switching 111 secs. (6) 111 secs. (6) 9 Below Average      Total Errors 0 errors (13) 2 errors (11) 63 Average        D-KEFS Verbal Fluency Test: Raw Score (Scaled Score) Raw Score (Scaled Score) Percentile     Letter Total Correct 23 (6) 12 (3) 1 Exceptionally Low    Category Total Correct 13 (2) 14 (2) <1 Exceptionally Low    Category Switching Total Correct 5 (2) 4 (1) <1 Exceptionally Low    Category Switching Accuracy 2 (1) 3 (2) <1 Exceptionally Low      Total Set Loss Errors 1 (11) 1  (11) 63 Average      Total Repetition Errors 1 (12) 2 (11) 63 Average        NAB Executive Functions Module, Form 1: T Score T Score Percentile     Judgment 58 71 98 Exceptionally High        Language:      Verbal Fluency Test: Raw Score (T Score) Raw Score (T Score) Percentile     Phonemic Fluency (FAS) 23 (32) 12 (18) <1 Exceptionally Low    Animal Fluency 8 (19) 6 (15) <1 Exceptionally Low         NAB Language Module, Form 1: T Score T Score Percentile     Auditory Comprehension 56 56 73 Average    Naming 29/31 (43) 30/31 (56) 73 Average        Visuospatial/Visuoconstruction:       Raw Score Raw Score Percentile   Clock Drawing: 10/10 8/10 --- Within Normal Limits        NAB Spatial Module, Form 1: T Score T Score Percentile     Figure Drawing Copy 53 57 75 Above Average         Scaled Score Scaled Score Percentile   WAIS-IV Block Design: 8 8 25  Average        Mood and Personality:       Raw Score Raw Score Percentile   Geriatric Depression Scale: 0 1 --- Within Normal Limits  Geriatric Anxiety Scale: 6 2 --- Minimal    Somatic 3 2 --- Minimal    Cognitive 3 0 --- Minimal    Affective 0 0 --- Minimal        Additional Questionnaires:       Raw Score Raw Score Percentile   PROMIS Sleep Disturbance Questionnaire: 13 12 --- None to Slight   Informed Consent and Coding/Compliance:   The current evaluation represents a clinical evaluation for the purposes previously outlined by the referral source and is in no way reflective of a forensic evaluation.   Mr. Claude was provided with a verbal description of the nature and purpose of the present neuropsychological evaluation. Also reviewed were the foreseeable risks and/or discomforts and benefits of the procedure, limits of confidentiality, and mandatory reporting requirements of this provider. The patient was given the opportunity to ask questions and receive answers about the evaluation. Oral consent to participate was  provided by the patient.   This evaluation was conducted by Newman Nickels, Ph.D., ABPP-CN, board certified clinical neuropsychologist. Mr. Wnorowski completed a clinical interview with Dr. Milbert Coulter, billed as one unit [phone], and 140 minutes of cognitive testing and scoring, billed as one unit [phone] and four additional units [id number]. Psychometrist Wallace Keller, B.S. assisted Dr. Milbert Coulter with test administration and scoring procedures. As  a separate and discrete service, one unit [id number] and two units [phone] were billed for Dr. Tammy Sours time spent in interpretation and report writing.

## 2023-07-24 NOTE — Progress Notes (Addendum)
 Cardiology Office Note:   Date:  07/25/2023  ID:  Richard Harmon, DOB Jan 03, 1951, MRN [id number] PCP:  Farris Has, MD  [facility] Providers Cardiologist:  Alverda Skeans, MD Referring MD: Farris Has, MD   Chief Complaint/Reason for Referral: Cardiology follow-up ASSESSMENT:    1. Aortic dilatation (HCC)   2. Essential hypertension   3. Dyslipidemia   4. Pre-procedure lab exam      PLAN:   In order of problems listed above: Aortic dilatation: Will obtain CTA in 6 months; given family history of aortopathy will refer to cardiac surgery after the CT scan has been completed.  Hypertension: Will stop amlodipine and start Toprol 25 mg due to aortic dilatation.  Continue Diovan/HCT. Hyperlipidemia: Lipids were at goal recently.  Dispo:  Return in about 9 months (around 04/23/2024).    I spent 31 minutes reviewing all clinical data during and prior to this visit including all relevant imaging studies, laboratories, clinical information from other health systems and prior notes from both Cardiology and other specialties, interviewing the patient, conducting a complete physical examination, and coordinating care in order to formulate a comprehensive and personalized evaluation and treatment plan.   Medication Adjustments/Labs and Tests Ordered: Current medicines are reviewed at length with the patient today.  Concerns regarding medicines are outlined above.  The following changes have been made:     Labs/tests ordered: Orders Placed This Encounter  Procedures   CT ANGIO CHEST AORTA W/CM & OR WO/CM   Basic Metabolic Panel (BMET)   Ambulatory referral to Cardiothoracic Surgery    Medication Changes: Meds ordered this encounter  Medications   metoprolol succinate (TOPROL XL) 25 MG 24 hr tablet    Sig: Take 1 tablet (25 mg total) by mouth daily.    Dispense:  90 tablet    Refill:  3    Stop amlodipine    Current medicines are reviewed at length with the patient today.   The patient does not have concerns regarding medicines.  History of Present Illness:   FOCUSED PROBLEM LIST:   Hyperlipidemia LP(a) 20.8 Bifascicular block (RBBB + LAFB) Hypertension BMI 28 Mild cognitive impairment Aortic dilatation 44 mm CTA 2024 LVH TTE 2024 FH of aortic aneurysm rupture (mother)  August 2024:  The patient is a 72 y.o. male with the indicated medical history here for recommendations regarding an incidental murmur.  The patient was seen by his PCP recently and was doing fairly well.  A murmur was appreciated at the LUSB.  The patient was then referred to cardiology.  The patient has been retired for 2 years.  He is very active.  He walks on a daily basis.  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  He denies any dyspnea, exertional discomfort, presyncope, syncope, palpitations, paroxysmal atrial dyspnea, orthopnea.  He is required no emergency room visits or hospitalizations.  He does not smoke or drink.  He is otherwise well and without significant complaints.  Plan: Obtain echocardiogram, increase amlodipine to 10 mg, check lipids, LFTs, LP(a).  November 2024: In the interim the patient had an echocardiogram which showed mild left ventricular hypertrophy and aortic dilatation.  He is referred for a CTA which demonstrated an aortic diameter of 44 mm in the ascending portion of the aorta.  His LDL and LP(a) were reassuring.  He is here to discuss these findings.  He does not have a family history of aortic aneurysm and he believes dissection with rupture.  His mother was 9  when she died and she did have an aneurysm.  She died in the 39s.  In terms of other issues the patient is doing quite well.  He denies any exertional angina, dyspnea, presyncope or syncope.  He is able to do all of his activities of daily living without any issues.         Current Medications: Current Meds  Medication Sig   aspirin EC 81 MG tablet Take by mouth.   atorvastatin (LIPITOR) 40  MG tablet Take 40 mg by mouth every evening.   Cholecalciferol 125 MCG (5000 UT) capsule 1 capsule Orally Once a day   cyanocobalamin (VITAMIN B12) 1000 MCG tablet Take 1,000 mcg by mouth daily.   dorzolamide-timolol (COSOPT) 2-0.5 % ophthalmic solution SMARTSIG:1 Drop(s) In Eye(s) Every 12 Hours   fluticasone (FLONASE) 50 MCG/ACT nasal spray SPRAY 1 SPRAY INTO EACH NOSTRIL EVERY DAY FOR 30 DAYS   Krill Oil 500 MG CAPS Take 500 mg by mouth every evening.   Melatonin 10 MG CAPS Take 10 mg by mouth at bedtime.   memantine (NAMENDA) 5 MG tablet Take 1 tablet (5 mg total) by mouth 2 (two) times daily.   metoprolol succinate (TOPROL XL) 25 MG 24 hr tablet Take 1 tablet (25 mg total) by mouth daily.   Nutritional Supplements (VITAMIN D BOOSTER PO) Take 2,000 mg by mouth.   Testosterone 20.25 MG/ACT (1.62%) GEL SMARTSIG:3-4 Pump T-DERMAL Daily   valsartan-hydrochlorothiazide (DIOVAN-HCT) 320-25 MG tablet Take 1 tablet by mouth every evening.   [DISCONTINUED] amLODipine (NORVASC) 10 MG tablet Take 1 tablet (10 mg total) by mouth daily.     Allergies:    Patient has no known allergies.   Social History:   Social History   Tobacco Use   Smoking status: Former    Current packs/day: 0.25    Average packs/day: 0.3 packs/day for 4.0 years (1.0 ttl pk-yrs)    Types: Cigarettes   Smokeless tobacco: Never   Tobacco comments:    Barrister's clerk Use   Vaping status: Never Used  Substance Use Topics   Alcohol use: Not Currently    Comment: Rare   Drug use: Not Currently     Family Hx: Family History  Problem Relation Age of Onset   Mental illness Mother        "mental breakdown"   Anuerysm Mother        Base neck, ruptured 1993. COD   Heart attack Father 64       COD     Review of Systems:   Please see the history of present illness.    All other systems reviewed and are negative.     EKGs/Labs/Other Test Reviewed:   EKG: EKG from August 2024 demonstrates sinus rhythm with right  bundle branch block  EKG Interpretation Date/Time:    Ventricular Rate:    PR Interval:    QRS Duration:    QT Interval:    QTC Calculation:   R Axis:      Text Interpretation:          Prior CV studies reviewed: Cardiac Studies & Procedures       ECHOCARDIOGRAM  ECHOCARDIOGRAM COMPLETE 05/14/2023  Narrative ECHOCARDIOGRAM REPORT    Patient Name:   Richard Harmon Date of Exam: 05/14/2023 Medical Rec #:  [id number]       Height:       69.0 in Accession #:    [id number]      Weight:  199.4 lb Date of Birth:  [birth year]       BSA:          2.063 m Patient Age:    72 years        BP:           144/86 mmHg Patient Gender: M               HR:           58 bpm. Exam Location:  [address]  Procedure: 2D Echo, Cardiac Doppler, Color Doppler, 3D Echo and Strain Analysis  Indications:    R01.1 Murmur  History:        Patient has no prior history of Echocardiogram examinations. Risk Factors:Hypertension and Dyslipidemia.  Sonographer:    Cathie Beams RCS Referring Phys: [id number] Orbie Pyo  IMPRESSIONS   1. Left ventricular ejection fraction, by estimation, is 65 to 70%. The left ventricle has normal function. The left ventricle has no regional wall motion abnormalities. There is mild left ventricular hypertrophy. Left ventricular diastolic parameters are indeterminate. 2. Right ventricular systolic function is normal. The right ventricular size is normal. 3. Chordal SAM MIld MR 4. Aortic dilatation noted. There is moderate dilatation of the ascending aorta, measuring 44 mm.  FINDINGS Left Ventricle: Left ventricular ejection fraction, by estimation, is 65 to 70%. The left ventricle has normal function. The left ventricle has no regional wall motion abnormalities. The left ventricular internal cavity size was normal in size. There is mild left ventricular hypertrophy. Left ventricular diastolic parameters are indeterminate.  Right Ventricle: The right  ventricular size is normal. Right vetricular wall thickness was not assessed. Right ventricular systolic function is normal.  Left Atrium: Left atrial size was normal in size.  Right Atrium: Right atrial size was normal in size.  Pericardium: There is no evidence of pericardial effusion.  Mitral Valve: Chordal SAM. The mitral valve is normal in structure. Mild mitral valve regurgitation.  Tricuspid Valve: The tricuspid valve is normal in structure. Tricuspid valve regurgitation is trivial.  Aortic Valve: The aortic valve is tricuspid. Aortic valve regurgitation is trivial. Aortic valve sclerosis/calcification is present, without any evidence of aortic stenosis.  Pulmonic Valve: The pulmonic valve was normal in structure. Pulmonic valve regurgitation is not visualized.  Aorta: The aortic root is normal in size and structure and aortic dilatation noted. There is moderate dilatation of the ascending aorta, measuring 44 mm.  IAS/Shunts: No atrial level shunt detected by color flow Doppler.   LEFT VENTRICLE PLAX 2D LVIDd:         4.60 cm   Diastology LVIDs:         2.90 cm   LV e' medial:    5.66 cm/s LV PW:         1.30 cm   LV E/e' medial:  14.7 LV IVS:        1.20 cm   LV e' lateral:   7.29 cm/s LVOT diam:     2.10 cm   LV E/e' lateral: 11.4 LV SV:         110 LV SV Index:   53 LVOT Area:     3.46 cm  3D Volume EF: 3D EF:        59 % LV EDV:       152 ml LV ESV:       63 ml LV SV:        89 ml  RIGHT VENTRICLE RV Basal diam:  3.60 cm RV S prime:     12.80 cm/s TAPSE (M-mode): 2.5 cm RVSP:           31.3 mmHg  LEFT ATRIUM             Index        RIGHT ATRIUM           Index LA diam:        4.60 cm 2.23 cm/m   RA Pressure: 3.00 mmHg LA Vol (A2C):   49.0 ml 23.75 ml/m  RA Area:     13.10 cm LA Vol (A4C):   63.8 ml 30.92 ml/m  RA Volume:   36.70 ml  17.79 ml/m LA Biplane Vol: 57.4 ml 27.82 ml/m AORTIC VALVE LVOT Vmax:   169.00 cm/s LVOT Vmean:  97.200 cm/s LVOT  VTI:    0.317 m  AORTA Ao Root diam: 3.90 cm Ao Asc diam:  4.40 cm  MITRAL VALVE               TRICUSPID VALVE MV Area (PHT): 2.82 cm    TR Peak grad:   28.3 mmHg MV Decel Time: 269 msec    TR Vmax:        266.00 cm/s MV E velocity: 83.30 cm/s  Estimated RAP:  3.00 mmHg MV A velocity: 95.90 cm/s  RVSP:           31.3 mmHg MV E/A ratio:  0.87 SHUNTS Systemic VTI:  0.32 m Systemic Diam: 2.10 cm  Dietrich Pates MD Electronically signed by Dietrich Pates MD Signature Date/Time: 05/14/2023/2:11:38 PM    Final             Recent Labs: 04/20/2023: ALT 23 06/12/2023: BUN 14; Creatinine, Ser 0.77; Potassium 4.5; Sodium 143   Lipid Panel    Component Value Date/Time   CHOL 112 04/20/2023 0828   TRIG 122 04/20/2023 0828   HDL 43 04/20/2023 0828   CHOLHDL 2.6 04/20/2023 0828   LDLCALC 47 04/20/2023 0828    Risk Assessment/Calculations:          Physical Exam:   VS:  BP 136/78   Pulse 64   Ht 5\' 9"  (1.753 m)   Wt 193 lb 6.4 oz (87.7 kg)   SpO2 97%   BMI 28.56 kg/m         Wt Readings from Last 3 Encounters:  07/25/23 193 lb 6.4 oz (87.7 kg)  04/20/23 199 lb 6.4 oz (90.4 kg)  02/23/23 194 lb (88 kg)      GENERAL:  No apparent distress, AOx3 HEENT:  No carotid bruits, +2 carotid impulses, no scleral icterus CAR: RRR no murmurs, gallops, rubs, or thrills RES:  Clear to auscultation bilaterally ABD:  Soft, nontender, nondistended, positive bowel sounds x 4 VASC:  +2 radial pulses, +2 carotid pulses NEURO:  CN 2-12 grossly intact; motor and sensory grossly intact PSYCH:  No active depression or anxiety EXT:  No edema, ecchymosis, or cyanosis  Signed, Orbie Pyo, MD  07/25/2023 4:24 PM    [facility] [address] [place], [place], [place]  [address] Phone: [phone]; Fax: [phone]   Note:  This document was prepared using Dragon voice recognition software and may include unintentional dictation errors.

## 2023-07-25 ENCOUNTER — Ambulatory Visit: Payer: Medicare PPO | Attending: Internal Medicine | Admitting: Internal Medicine

## 2023-07-25 ENCOUNTER — Encounter: Payer: Self-pay | Admitting: Internal Medicine

## 2023-07-25 VITALS — BP 136/78 | HR 64 | Ht 69.0 in | Wt 193.4 lb

## 2023-07-25 DIAGNOSIS — I10 Essential (primary) hypertension: Secondary | ICD-10-CM | POA: Diagnosis not present

## 2023-07-25 DIAGNOSIS — E785 Hyperlipidemia, unspecified: Secondary | ICD-10-CM | POA: Diagnosis not present

## 2023-07-25 DIAGNOSIS — I77819 Aortic ectasia, unspecified site: Secondary | ICD-10-CM | POA: Diagnosis not present

## 2023-07-25 DIAGNOSIS — Z01812 Encounter for preprocedural laboratory examination: Secondary | ICD-10-CM

## 2023-07-25 MED ORDER — METOPROLOL SUCCINATE ER 25 MG PO TB24: 25.0000 mg | ORAL_TABLET | Freq: Every day | ORAL | 3 refills | Status: DC

## 2023-07-25 NOTE — Patient Instructions (Signed)
Medication Instructions:  Your physician has recommended you make the following change in your medication:  1.) stop amlodipine 2.) start Toprol XL (metoprolol succinate) 25 mg - one tablet daily  *If you need a refill on your cardiac medications before your next appointment, please call your pharmacy*   Lab Work: 1-2 weeks before the CT scan in May, return for blood work.  You may go to any [facility] for this.  If you have labs (blood work) drawn today and your tests are completely normal, you will receive your results only by: MyChart Message (if you have MyChart) OR A paper copy in the mail If you have any lab test that is abnormal or we need to change your treatment, we will call you to review the results.   Testing/Procedures: Chest CTA Aorta - due in May 2025   Follow-Up: At [facility], you and your health needs are our priority.  As part of our continuing mission to provide you with exceptional heart care, we have created designated Provider Care Teams.  These Care Teams include your primary Cardiologist (physician) and Advanced Practice Providers (APPs -  Physician Assistants and Nurse Practitioners) who all work together to provide you with the care you need, when you need it.   Your next appointment:   9 month(s)  Provider:   Orbie Pyo, MD     Other Instructions You have been referred to Cardiothoracic Surgery - you should be seen after the CT scan in May 2025

## 2023-07-26 ENCOUNTER — Ambulatory Visit: Payer: Medicare PPO | Admitting: Psychology

## 2023-07-26 DIAGNOSIS — G3184 Mild cognitive impairment, so stated: Secondary | ICD-10-CM | POA: Diagnosis not present

## 2023-07-26 DIAGNOSIS — G309 Alzheimer's disease, unspecified: Secondary | ICD-10-CM

## 2023-07-26 NOTE — Progress Notes (Signed)
   Neuropsychology Feedback Session Richard Harmon. [facility] Department of Neurology  Reason for Referral:   [name] is a 72 y.o. right-handed Caucasian male referred by Marlowe Kays, PA-C, to characterize his current cognitive functioning and assist with diagnostic clarity and treatment planning in the context of a prior mild neurocognitive disorder diagnosis and concerns for progressive decline over time.   Feedback:   Mr. Bruer completed a comprehensive neuropsychological evaluation on 07/20/2023. Please refer to that encounter for the full report and recommendations. Briefly, results suggested severe impairment surrounding verbal fluency (both phonemic and semantic), as well as all aspects of verbal learning and memory. Further weakness was exhibited across executive functioning and both delayed retrieval and recognition/consolidation aspects of visual memory. Performance variability was exhibited across processing speed. Relative to his previous evaluation in November 2023, mild to moderate decline was exhibited across verbal fluency and all aspects of memory, both verbal and visual. More subtle decline was further exhibited across processing speed and executive functioning. Stability was exhibited across other assessed domains. Regarding the cause of severe memory impairment and other areas of cognitive dysfunction, I continue to have concerns surrounding underlying Alzheimer's disease. Across memory testing, he had significant trouble learning novel information efficiently, exhibited severe impairments recalling previously learned information after a brief delay, and consistently performed poorly across yes/no recognition trials. Verbal memory did exhibit greater dysfunction than visual memory, with verbal memory retention rates including 0%, 6%, and 43% across list, daily living, and story based tasks respectively. Taken together, this suggests rapid forgetting and a  prominent storage impairment, both of which are the hallmark testing characteristics of this illness. Similar patterns were exhibited during his 2023 evaluation. However, evidence for memory decline over time further elevates concerns for an underlying neurodegenerative process such as Alzheimer's disease.  Mr. Marks was unaccompanied during the current feedback session. Content of the current session focused on the results of his neuropsychological evaluation. Mr. Buggy was given the opportunity to ask questions and his questions were answered. He was encouraged to reach out should additional questions arise. A copy of his report was provided at the conclusion of the visit.      One unit [phone] was billed for Dr. Tammy Sours time spent preparing for, conducting, and documenting the current feedback session with Mr. Sia.

## 2023-08-08 ENCOUNTER — Encounter: Payer: Self-pay | Admitting: Physician Assistant

## 2023-08-08 ENCOUNTER — Ambulatory Visit: Payer: Medicare PPO | Admitting: Physician Assistant

## 2023-08-08 VITALS — BP 157/92 | HR 60 | Ht 69.0 in | Wt 199.0 lb

## 2023-08-08 DIAGNOSIS — G309 Alzheimer's disease, unspecified: Secondary | ICD-10-CM | POA: Diagnosis not present

## 2023-08-08 DIAGNOSIS — G3184 Mild cognitive impairment, so stated: Secondary | ICD-10-CM

## 2023-08-08 MED ORDER — MEMANTINE HCL 10 MG PO TABS: 10.0000 mg | ORAL_TABLET | Freq: Two times a day (BID) | ORAL | 3 refills | Status: DC

## 2023-08-08 NOTE — Progress Notes (Signed)
Assessment/Plan:   Mild Cognitive Impairment, concern for Alzheimer's disease  Richard Harmon is a very pleasant 72 y.o. RH male with a history of aortic dilatation, hyperlipidemia, RBBB, and a diagnosis of MCI with concerns for Alzheimer's disease by repeat neuropsych evaluation 07/2023  presenting today in follow-up for evaluation of memory loss. Patient is on memantine 5 mg bid, tolerating well.  Discussed increasing memantine 10 mg twice daily for broader coverage after the above diagnosis, patient agreed. patient is able to participate on his ADLs and to drive without difficulties. Richard Harmon is good.         Recommendations:   Follow up in 6  months. Increase memantine to 10 mg twice daily. Side effects were discussed  Repeat neuropsych evaluation in 12 to 18 months continue tracking progression over time Recommend good control of cardiovascular risk factors. Patient informed of elevated BP today Continue to control mood as per PCP    Subjective:   This patient is accompanied in the office by his daughter  who supplements the history. Previous records as well as any outside records available were reviewed prior to todays visit.  Patient was last seen on 02/23/23 with MMSE 27/30     Any changes in memory since last visit? " Doing well, no new issues with memory". 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 LTM is good. Likes to read. Does not a lot of brain games.  He enjoys watching Jeopardy. repeats oneself?  Endorsed. Disoriented when walking into a room?  Patient denies  Misplacing objects?  Patient denies   Wandering behavior?   denies   Any personality changes since last visit?   denies   Any worsening depression?: denies   Hallucinations or paranoia?  denies   Seizures?   denies    Any sleep changes? Sleeps well. Denies vivid dreams, REM behavior or sleepwalking   Sleep apnea?   Denies.  Any hygiene concerns?   denies   Independent of bathing and  dressing?  Endorsed  Does the patient needs help with medications? Patient is in charge  Who is in charge of the finances?  Patient is in charge    Any changes in appetite?  Denies.    Patient have trouble swallowing?  denies   Does the patient cook?  Yes, denies any issues.  Any headaches?    Denies.   Vision changes? Denies. Chronic pain?  Denies.   Ambulates with difficulty?  Denies. Likes to walk at [place] [employer]. He does [employer] engineer as well.   Recent falls or head injuries?  Denies.      Unilateral weakness, numbness or tingling? Denies.   Any tremors?  Denies.   Any anosmia?    denies   Any incontinence of urine?  denies   Any bowel dysfunction?  denies      Patient lives with his wife.  Does the patient drive?  Yes, denies getting lost.   Initial visit May 2023  How long did patient have memory difficulties?  "Before Covid I was ok, then developed brain fog until early this year, then took B12 and over the last couple of months I feel great " repeats oneself? Occasionally  Disoriented when walking into a room?  Patient denies   Leaving objects in unusual places?  Patient denies   Ambulates  with difficulty?   Patient denies. He walks 4-5 times a week   Recent falls?  Patient denies   Any head  injuries?  Patient denies   History of seizures?   Patient denies   Wandering behavior?  Patient denies   Patient drives?  "  I never get lost. Patient uses GPS to drive " Any mood changes such irritability agitation?  Patient denies   Any history of depression?:  Patient denies   Hallucinations?  Patient denies   Paranoia?  Patient denies   Patient reports that he sleeps " I have always been an early riser " Denies vivid dreams, REM behavior or sleepwalking    History of sleep apnea?  Patient denies   Any hygiene concerns?  Patient denies   Independent of bathing and dressing?  Endorsed  Does the patient needs help with medications?   Patient denies   Who is in charge of the  finances?  Patient is in charge   Any changes in appetite?  Patient denies. Put on some weight because of late night snacks  Patient have trouble swallowing? Patient denies   Does the patient cook?  Patient denies   Any kitchen accidents such as leaving the stove on? Patient denies   Any headaches?  Patient denies   The double vision? Patient denies   Any focal numbness or tingling?  Patient denies   Chronic back pain Patient denies   Unilateral weakness?  Patient denies   Any tremors?  Patient denies   Any history of anosmia?  Patient denies   Any incontinence of urine?  Patient denies   Any bowel dysfunction?   Patient denies     History of heavy alcohol intake?  Patient denies   History of heavy tobacco use?  Patient denies   Family history of dementia?  Patient denies  Mo died in 24-Aug-1993 , she had brain multiinfarct .  Worked at [employer] retired in October 2022, shutting the program down had impact on him.  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 and goes to the [facility] regularly at [employer]   Neuropsych evaluation 07/20/2023 Briefly, results suggested severe impairment surrounding verbal fluency (both phonemic and semantic), as well as all aspects of verbal learning and memory. Further weakness was exhibited across executive functioning and both delayed retrieval and recognition/consolidation aspects of visual memory. Performance variability was exhibited across processing speed. Relative to his previous evaluation in November 2023, mild to moderate decline was exhibited across verbal fluency and all aspects of memory, both verbal and visual. More subtle decline was further exhibited across processing speed and executive functioning. Stability was exhibited across other assessed domains. Regarding the cause of severe memory impairment and other areas of cognitive dysfunction, I continue to have concerns surrounding underlying Alzheimer's disease. Across  memory testing, he had significant trouble learning novel information efficiently, exhibited severe impairments recalling previously learned information after a brief delay, and consistently performed poorly across yes/no recognition trials. Verbal memory did exhibit greater dysfunction than visual memory, with verbal memory retention rates including 0%, 6%, and 43% across list, daily living, and story based tasks respectively. Taken together, this suggests rapid forgetting and a prominent storage impairment, both of which are the hallmark testing characteristics of this illness. Similar patterns were exhibited during his Aug 24, 2022 evaluation. However, evidence for memory decline over time further elevates concerns for an underlying neurodegenerative process such as Alzheimer's disease.     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    atorvastatin (LIPITOR) 40 MG tablet Take 40 mg by mouth every evening.   Cholecalciferol 125 MCG (5000 UT) capsule 1 capsule Orally Once a day   cyanocobalamin (VITAMIN B12) 1000 MCG tablet Take 1,000 mcg by mouth daily.   fluticasone (FLONASE) 50 MCG/ACT nasal spray SPRAY 1 SPRAY INTO EACH NOSTRIL EVERY DAY FOR 30 DAYS   Krill Oil 500 MG CAPS Take 500 mg by mouth every evening.   metoprolol succinate (TOPROL XL) 25 MG 24 hr tablet Take 1 tablet (25 mg total) by mouth daily.   Nutritional Supplements (VITAMIN D BOOSTER PO) Take 2,000 mg by mouth.   Testosterone 20.25 MG/ACT (1.62%) GEL SMARTSIG:3-4 Pump T-DERMAL Daily   valsartan-hydrochlorothiazide (DIOVAN-HCT) 320-25 MG tablet Take 1 tablet by mouth every evening.   [DISCONTINUED] memantine (NAMENDA) 5 MG tablet Take 1 tablet (5 mg total) by mouth 2 (two) times daily.   dorzolamide-timolol (COSOPT) 2-0.5 % ophthalmic solution SMARTSIG:1 Drop(s) In Eye(s) Every 12 Hours   Melatonin 10 MG CAPS Take 10 mg by mouth at bedtime.   memantine (NAMENDA) 10 MG tablet Take 1 tablet (10 mg total) by mouth 2 (two) times daily.   No facility-administered encounter medications on file as of 08/08/2023.     Objective:     PHYSICAL EXAMINATION:    VITALS:   Vitals:   08/08/23 0852 08/08/23 0941  BP: (!) 144/76 (!) 157/92  Pulse: 60   SpO2: 98%   Weight: 199 lb (90.3 kg)   Height: 5\' 9"  (1.753 m)     GEN:  The patient appears stated age and is in NAD. HEENT:  Normocephalic, atraumatic.   Neurological examination:  General: NAD, well-groomed, appears stated age. Orientation: The patient is alert. Oriented to person, place and date Cranial nerves: There is good facial symmetry.The speech is fluent and clear. No aphasia or dysarthria. Fund of knowledge is appropriate. Recent memory impaired and remote memory is normal.  Attention and concentration are normal.  Able to name objects and repeat phrases.  Hearing is intact to conversational tone.    Sensation: Sensation is intact to light touch throughout Motor: Strength is at least antigravity x4. DTR's 2/4 in UE/LE      01/18/2022    8:00 AM  Montreal Cognitive Assessment   Visuospatial/ Executive (0/5) 4  Naming (0/3) 2  Attention: Read list of digits (0/2) 2  Attention: Read list of letters (0/1) 1  Attention: Serial  7 subtraction starting at 100 (0/3) 3  Language: Repeat phrase (0/2) 2  Language : Fluency (0/1) 0  Abstraction (0/2) 2  Delayed Recall (0/5) 0  Orientation (0/6) 5  Total 21       02/23/2023   10:00 AM 08/24/2022    9:00 AM  MMSE - Mini Mental State Exam  Orientation to time 5 4  Orientation to Place 5 5  Registration 3 3  Attention/ Calculation 5 5  Recall 0 1  Language- name 2 objects 2 2  Language- repeat 1 1  Language- follow 3 step command 3 3  Language- read & follow direction 1 1  Write a sentence 1 1  Copy design 1 1  Total score 27 27       Movement examination: Tone: There is normal tone in the UE/LE Abnormal movements:  no tremor.  No myoclonus.  No asterixis.   Coordination:  There is no decremation with RAM's. Normal finger to nose  Gait and Station: The patient has no difficulty arising out of a deep-seated chair without the use of the hands. The patient's stride length is good.  Gait is cautious and narrow.   Thank you for allowing [place] the opportunity to participate in the care of this nice patient. Please do not hesitate to contact us for any questions or concerns.   Total time spent on today's visit was 30 minutes dedicated to this patient today, preparing to see patient, examining the patient, ordering tests and/or medications and counseling the patient, documenting clinical information in the EHR or other health record, independently interpreting results and communicating results to the patient/family, discussing treatment and goals, answering patient's questions and coordinating care.  Cc:  Farris Has, MD  Marlowe Kays 08/08/2023 10:45 AM

## 2023-08-08 NOTE — Patient Instructions (Signed)
It was a pleasure to see you today at our office.   Recommendations:   Increase Memantine 10 mg twice daily. Side effects were discussed  Follow up in 6 months  Whom to call:  Memory  decline, memory medications: Call out office [phone]   For psychiatric meds, mood meds: Please have your primary care physician manage these medications.       RECOMMENDATIONS FOR ALL PATIENTS WITH MEMORY PROBLEMS: 1. Continue to exercise (Recommend 30 minutes of walking everyday, or 3 hours every week) 2. Increase social interactions - continue going to [place] and enjoy social gatherings with friends and family 3. Eat healthy, avoid fried foods and eat more fruits and vegetables 4. Maintain adequate blood pressure, blood sugar, and blood cholesterol level. Reducing the risk of stroke and cardiovascular disease also helps promoting better memory. 5. Avoid stressful situations. Live a simple life and avoid aggravations. Organize your time and prepare for the next day in anticipation. 6. Sleep well, avoid any interruptions of sleep and avoid any distractions in the bedroom that may interfere with adequate sleep quality 7. Avoid sugar, avoid sweets as there is a strong link between excessive sugar intake, diabetes, and cognitive impairment We discussed the Mediterranean diet, which has been shown to help patients reduce the risk of progressive memory disorders and reduces cardiovascular risk. This includes eating fish, eat fruits and green leafy vegetables, nuts like almonds and hazelnuts, walnuts, and also use olive oil. Avoid fast foods and fried foods as much as possible. Avoid sweets and sugar as sugar use has been linked to worsening of memory function.  There is always a concern of gradual progression of memory problems. If this is the case, then we may need to adjust level of care according to patient needs. Support, both to the patient and caregiver, should then be put into place.       FALL  PRECAUTIONS: Be cautious when walking. Scan the area for obstacles that may increase the risk of trips and falls. When getting up in the mornings, sit up at the edge of the bed for a few minutes before getting out of bed. Consider elevating the bed at the head end to avoid drop of blood pressure when getting up. Walk always in a well-lit room (use night lights in the walls). Avoid area rugs or power cords from appliances in the middle of the walkways. Use a walker or a cane if necessary and consider physical therapy for balance exercise. Get your eyesight checked regularly.  FINANCIAL OVERSIGHT: Supervision, especially oversight when making financial decisions or transactions is also recommended.  HOME SAFETY: Consider the safety of the kitchen when operating appliances like stoves, microwave oven, and blender. Consider having supervision and share cooking responsibilities until no longer able to participate in those. Accidents with firearms and other hazards in the house should be identified and addressed as well.   ABILITY TO BE LEFT ALONE: If patient is unable to contact 911 operator, consider using LifeLine, or when the need is there, arrange for someone to stay with patients. Smoking is a fire hazard, consider supervision or cessation. Risk of wandering should be assessed by caregiver and if detected at any point, supervision and safe proof recommendations should be instituted.  MEDICATION SUPERVISION: Inability to self-administer medication needs to be constantly addressed. Implement a mechanism to ensure safe administration of the medications.   DRIVING: Regarding driving, in patients with progressive memory problems, driving will be impaired. We advise to have someone else  do the driving if trouble finding directions or if minor accidents are reported. Independent driving assessment is available to determine safety of driving.   If you are interested in the driving assessment, you can contact  the following:  [facility] in [place] [phone]  [facility] [phone]  [facility] [phone] or [phone]    Mediterranean Diet A Mediterranean diet refers to food and lifestyle choices that are based on the traditions of countries located on the [employer] [place]. This way of eating has been shown to help prevent certain conditions and improve outcomes for people who have chronic diseases, like kidney disease and heart disease. What are tips for following this plan? Lifestyle  Cook and eat meals together with your family, when possible. Drink enough fluid to keep your urine clear or pale yellow. Be physically active every day. This includes: Aerobic exercise like running or swimming. Leisure activities like gardening, walking, or housework. Get 7-8 hours of sleep each night. If recommended by your health care provider, drink red wine in moderation. This means 1 glass a day for nonpregnant women and 2 glasses a day for men. A glass of wine equals 5 oz (150 mL). Reading food labels  Check the serving size of packaged foods. For foods such as rice and pasta, the serving size refers to the amount of cooked product, not dry. Check the total fat in packaged foods. Avoid foods that have saturated fat or trans fats. Check the ingredients list for added sugars, such as corn syrup. Shopping  At the grocery store, buy most of your food from the areas near the walls of the store. This includes: Fresh fruits and vegetables (produce). Grains, beans, nuts, and seeds. Some of these may be available in unpackaged forms or large amounts (in bulk). Fresh seafood. Poultry and eggs. Low-fat dairy products. Buy whole ingredients instead of prepackaged foods. Buy fresh fruits and vegetables in-season from local farmers markets. Buy frozen fruits and vegetables in resealable bags. If you do not have access to  quality fresh seafood, buy precooked frozen shrimp or canned fish, such as tuna, salmon, or sardines. Buy small amounts of raw or cooked vegetables, salads, or olives from the deli or salad bar at your store. Stock your pantry so you always have certain foods on hand, such as olive oil, canned tuna, canned tomatoes, rice, pasta, and beans. Cooking  Cook foods with extra-virgin olive oil instead of using butter or other vegetable oils. Have meat as a side dish, and have vegetables or grains as your main dish. This means having meat in small portions or adding small amounts of meat to foods like pasta or stew. Use beans or vegetables instead of meat in common dishes like chili or lasagna. Experiment with different cooking methods. Try roasting or broiling vegetables instead of steaming or sauteing them. Add frozen vegetables to soups, stews, pasta, or rice. Add nuts or seeds for added healthy fat at each meal. You can add these to yogurt, salads, or vegetable dishes. Marinate fish or vegetables using olive oil, lemon juice, garlic, and fresh herbs. Meal planning  Plan to eat 1 vegetarian meal one day each week. Try to work up to 2 vegetarian meals, if possible. Eat seafood 2 or more times a week. Have healthy snacks readily available, such as: Vegetable sticks with hummus. Greek yogurt. Fruit and nut trail mix. Eat balanced meals throughout the week. This includes: Fruit: 2-3 servings a day Vegetables:  4-5 servings a day Low-fat dairy: 2 servings a day Fish, poultry, or lean meat: 1 serving a day Beans and legumes: 2 or more servings a week Nuts and seeds: 1-2 servings a day Whole grains: 6-8 servings a day Extra-virgin olive oil: 3-4 servings a day Limit red meat and sweets to only a few servings a month What are my food choices? Mediterranean diet Recommended Grains: Whole-grain pasta. Brown rice. Bulgar wheat. Polenta. Couscous. Whole-wheat bread. Orpah Cobb. Vegetables:  Artichokes. Beets. Broccoli. Cabbage. Carrots. Eggplant. Green beans. Chard. Kale. Spinach. Onions. Leeks. Peas. Squash. Tomatoes. Peppers. Radishes. Fruits: Apples. Apricots. Avocado. Berries. Bananas. Cherries. Dates. Figs. Grapes. Lemons. Melon. Oranges. Peaches. Plums. Pomegranate. Meats and other protein foods: Beans. Almonds. Sunflower seeds. Pine nuts. Peanuts. Cod. Salmon. Scallops. Shrimp. Tuna. Tilapia. Clams. Oysters. Eggs. Dairy: Low-fat milk. Cheese. Greek yogurt. Beverages: Water. Red wine. Herbal tea. Fats and oils: Extra virgin olive oil. Avocado oil. Grape seed oil. Sweets and desserts: [place] yogurt with honey. Baked apples. Poached pears. Trail mix. Seasoning and other foods: Basil. Cilantro. Coriander. Cumin. Mint. Parsley. Sage. Rosemary. Tarragon. Garlic. Oregano. Thyme. Pepper. Balsalmic vinegar. Tahini. Hummus. Tomato sauce. Olives. Mushrooms. Limit these Grains: Prepackaged pasta or rice dishes. Prepackaged cereal with added sugar. Vegetables: Deep fried potatoes (french fries). Fruits: Fruit canned in syrup. Meats and other protein foods: Beef. Pork. Lamb. Poultry with skin. Hot dogs. Tomasa Blase. Dairy: Ice cream. Sour cream. Whole milk. Beverages: Juice. Sugar-sweetened soft drinks. Beer. Liquor and spirits. Fats and oils: Butter. Canola oil. Vegetable oil. Beef fat (tallow). Lard. Sweets and desserts: Cookies. Cakes. Pies. Candy. Seasoning and other foods: Mayonnaise. Premade sauces and marinades. The items listed may not be a complete list. Talk with your dietitian about what dietary choices are right for you. Summary The Mediterranean diet includes both food and lifestyle choices. Eat a variety of fresh fruits and vegetables, beans, nuts, seeds, and whole grains. Limit the amount of red meat and sweets that you eat. Talk with your health care provider about whether it is safe for you to drink red wine in moderation. This means 1 glass a day for nonpregnant women and 2  glasses a day for men. A glass of wine equals 5 oz (150 mL). This information is not intended to replace advice given to you by your health care provider. Make sure you discuss any questions you have with your health care provider. Document Released: 04/13/2016 Document Revised: 05/16/2016 Document Reviewed: 04/13/2016 Elsevier Interactive Patient Education  2017 [employer].   We have sent a referral to [facility] for your MRI and they will call you directly to schedule your appointment. They are located at [address] [facility]. If you need to contact them directly please call [phone].

## 2023-08-21 DIAGNOSIS — E785 Hyperlipidemia, unspecified: Secondary | ICD-10-CM | POA: Diagnosis not present

## 2023-08-21 DIAGNOSIS — E291 Testicular hypofunction: Secondary | ICD-10-CM | POA: Diagnosis not present

## 2023-08-21 DIAGNOSIS — I10 Essential (primary) hypertension: Secondary | ICD-10-CM | POA: Diagnosis not present

## 2023-08-21 DIAGNOSIS — D649 Anemia, unspecified: Secondary | ICD-10-CM | POA: Diagnosis not present

## 2023-08-21 DIAGNOSIS — Z23 Encounter for immunization: Secondary | ICD-10-CM | POA: Diagnosis not present

## 2023-08-21 DIAGNOSIS — Z0000 Encounter for general adult medical examination without abnormal findings: Secondary | ICD-10-CM | POA: Diagnosis not present

## 2023-08-21 DIAGNOSIS — I77819 Aortic ectasia, unspecified site: Secondary | ICD-10-CM | POA: Diagnosis not present

## 2023-08-21 DIAGNOSIS — R7309 Other abnormal glucose: Secondary | ICD-10-CM | POA: Diagnosis not present

## 2023-08-21 DIAGNOSIS — R413 Other amnesia: Secondary | ICD-10-CM | POA: Diagnosis not present

## 2023-09-27 DIAGNOSIS — D509 Iron deficiency anemia, unspecified: Secondary | ICD-10-CM | POA: Diagnosis not present

## 2023-10-18 ENCOUNTER — Ambulatory Visit: Payer: Medicare PPO | Admitting: Internal Medicine

## 2023-10-29 DIAGNOSIS — R0981 Nasal congestion: Secondary | ICD-10-CM | POA: Diagnosis not present

## 2023-10-29 DIAGNOSIS — R5383 Other fatigue: Secondary | ICD-10-CM | POA: Diagnosis not present

## 2023-10-29 DIAGNOSIS — R059 Cough, unspecified: Secondary | ICD-10-CM | POA: Diagnosis not present

## 2023-11-01 DIAGNOSIS — R051 Acute cough: Secondary | ICD-10-CM | POA: Diagnosis not present

## 2023-11-01 DIAGNOSIS — J159 Unspecified bacterial pneumonia: Secondary | ICD-10-CM | POA: Diagnosis not present

## 2023-11-04 ENCOUNTER — Emergency Department (HOSPITAL_BASED_OUTPATIENT_CLINIC_OR_DEPARTMENT_OTHER): Admitting: Radiology

## 2023-11-04 ENCOUNTER — Encounter (HOSPITAL_BASED_OUTPATIENT_CLINIC_OR_DEPARTMENT_OTHER): Payer: Self-pay | Admitting: Emergency Medicine

## 2023-11-04 ENCOUNTER — Other Ambulatory Visit: Payer: Self-pay

## 2023-11-04 ENCOUNTER — Emergency Department (HOSPITAL_BASED_OUTPATIENT_CLINIC_OR_DEPARTMENT_OTHER)
Admission: EM | Admit: 2023-11-04 | Discharge: 2023-11-04 | Disposition: A | Discharge status: Home / Self Care | Attending: Emergency Medicine | Admitting: Emergency Medicine

## 2023-11-04 DIAGNOSIS — R051 Acute cough: Secondary | ICD-10-CM | POA: Insufficient documentation

## 2023-11-04 DIAGNOSIS — Z79899 Other long term (current) drug therapy: Secondary | ICD-10-CM | POA: Diagnosis not present

## 2023-11-04 DIAGNOSIS — R0981 Nasal congestion: Secondary | ICD-10-CM | POA: Diagnosis not present

## 2023-11-04 DIAGNOSIS — R0600 Dyspnea, unspecified: Secondary | ICD-10-CM | POA: Diagnosis not present

## 2023-11-04 DIAGNOSIS — R0602 Shortness of breath: Secondary | ICD-10-CM | POA: Diagnosis not present

## 2023-11-04 DIAGNOSIS — J9811 Atelectasis: Secondary | ICD-10-CM | POA: Diagnosis not present

## 2023-11-04 DIAGNOSIS — I10 Essential (primary) hypertension: Secondary | ICD-10-CM | POA: Insufficient documentation

## 2023-11-04 DIAGNOSIS — Z7982 Long term (current) use of aspirin: Secondary | ICD-10-CM | POA: Insufficient documentation

## 2023-11-04 DIAGNOSIS — R059 Cough, unspecified: Secondary | ICD-10-CM | POA: Diagnosis not present

## 2023-11-04 DIAGNOSIS — R0989 Other specified symptoms and signs involving the circulatory and respiratory systems: Secondary | ICD-10-CM | POA: Diagnosis not present

## 2023-11-04 DIAGNOSIS — R062 Wheezing: Secondary | ICD-10-CM | POA: Diagnosis not present

## 2023-11-04 LAB — CBC
HCT: 57.6 % — ABNORMAL HIGH (ref 39.0–52.0)
Hemoglobin: 18.8 g/dL — ABNORMAL HIGH (ref 13.0–17.0)
MCH: 27.2 pg (ref 26.0–34.0)
MCHC: 32.6 g/dL (ref 30.0–36.0)
MCV: 83.5 fL (ref 80.0–100.0)
Platelets: 214 10*3/uL (ref 150–400)
RBC: 6.9 MIL/uL — ABNORMAL HIGH (ref 4.22–5.81)
RDW: 21.2 % — ABNORMAL HIGH (ref 11.5–15.5)
WBC: 8.9 10*3/uL (ref 4.0–10.5)
nRBC: 0 % (ref 0.0–0.2)

## 2023-11-04 LAB — RESP PANEL BY RT-PCR (RSV, FLU A&B, COVID)  RVPGX2
Influenza A by PCR: NEGATIVE
Influenza B by PCR: NEGATIVE
Resp Syncytial Virus by PCR: NEGATIVE
SARS Coronavirus 2 by RT PCR: NEGATIVE

## 2023-11-04 LAB — COMPREHENSIVE METABOLIC PANEL
ALT: 23 U/L (ref 0–44)
AST: 23 U/L (ref 15–41)
Albumin: 4.6 g/dL (ref 3.5–5.0)
Alkaline Phosphatase: 73 U/L (ref 38–126)
Anion gap: 7 (ref 5–15)
BUN: 16 mg/dL (ref 8–23)
CO2: 29 mmol/L (ref 22–32)
Calcium: 9.3 mg/dL (ref 8.9–10.3)
Chloride: 103 mmol/L (ref 98–111)
Creatinine, Ser: 0.73 mg/dL (ref 0.61–1.24)
GFR, Estimated: >60 mL/min (ref >60)
Glucose, Bld: 106 mg/dL — ABNORMAL HIGH (ref 70–99)
Potassium: 4.2 mmol/L (ref 3.5–5.1)
Sodium: 139 mmol/L (ref 135–145)
Total Bilirubin: 0.6 mg/dL (ref 0.0–1.2)
Total Protein: 6.8 g/dL (ref 6.5–8.1)

## 2023-11-04 LAB — BRAIN NATRIURETIC PEPTIDE: B Natriuretic Peptide: 31.1 pg/mL (ref 0.0–100.0)

## 2023-11-04 LAB — D-DIMER, QUANTITATIVE: D-Dimer, Quant: <0.27 ug{FEU}/mL (ref 0.00–0.50)

## 2023-11-04 MED ORDER — ALBUTEROL SULFATE HFA 108 (90 BASE) MCG/ACT IN AERS
2.0000 | INHALATION_SPRAY | RESPIRATORY_TRACT | Status: DC | PRN
  Administered 2023-11-04: 2 via RESPIRATORY_TRACT
  Filled 2023-11-04: qty 6.7

## 2023-11-04 MED ORDER — IPRATROPIUM-ALBUTEROL 0.5-2.5 (3) MG/3ML IN SOLN
3.0000 mL | Freq: Once | RESPIRATORY_TRACT | Status: AC
  Administered 2023-11-04: 3 mL via RESPIRATORY_TRACT
  Filled 2023-11-04: qty 3

## 2023-11-04 MED ORDER — PREDNISONE 20 MG PO TABS: 40.0000 mg | ORAL_TABLET | Freq: Every day | ORAL | 0 refills | Status: DC

## 2023-11-04 NOTE — ED Notes (Signed)
 Pt's initial Peak Flow was 175 and the Peak flow after the breathing tx was 350.

## 2023-11-04 NOTE — ED Triage Notes (Signed)
 Pt arrived with c/o cough, fever and congestion. Went to [facility] this week, swabbed and + with flu on Monday. Thursday went back, + for PNA. O2 sats today at UC was 93% and did CXR. Sent here for further testing. No signs of respiratory distress. SOB with exertion.

## 2023-11-04 NOTE — ED Notes (Signed)
Called for rooming; no answer.

## 2023-11-04 NOTE — ED Provider Notes (Signed)
 [place] [facility] AT [facility] Provider Note   CSN: [id number] Arrival date & time: 11/04/23  1110     History  Chief Complaint  Patient presents with   Cough   Nasal Congestion   Shortness of Breath    Richard Harmon is a 73 y.o. 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  Patient reports illness for started he went to an urgent care and tested positive for influenza A.  He was started on Tamiflu.  After approximately 5 days he did not have significant improvement in symptoms and had return to urgent care for further evaluation at which point he was found to have infiltrate suggesting pneumonia on chest x-ray.  Since then he continues to have productive cough and has had some mild intermittent shortness of breath.  He reports shortness of breath is worse and he is exerting himself.  He has no significant smoking history.  No history of coronary artery disease.  No history of DVT or PE.  He has no unilateral calf swelling or signs of DVT on exam.  He was specifically sent here from urgent care to rule out pulmonary embolism.   Cough Associated symptoms: shortness of breath   Shortness of Breath Associated symptoms: cough        Home Medications Prior to Admission medications   Medication Sig Start Date End Date Taking? Authorizing Provider  aspirin EC 81 MG tablet Take by mouth. 03/03/20   [provider]  atorvastatin (LIPITOR) 40 MG tablet Take 40 mg by mouth every evening.    [provider]  Cholecalciferol 125 MCG (5000 UT) capsule 1 capsule Orally Once a day    [provider]  cyanocobalamin (VITAMIN B12) 1000 MCG tablet Take 1,000 mcg by mouth daily.    [provider]  dorzolamide-timolol (COSOPT) 2-0.5 % ophthalmic solution SMARTSIG:1  Drop(s) In Eye(s) Every 12 Hours    [provider]  fluticasone (FLONASE) 50 MCG/ACT nasal spray SPRAY 1 SPRAY INTO EACH NOSTRIL EVERY DAY FOR 30 DAYS 06/26/21   [provider]  Krill Oil 500 MG CAPS Take 500 mg by mouth every evening.    [provider]  Melatonin 10 MG CAPS Take 10 mg by mouth at bedtime.    [provider]  memantine (NAMENDA) 10 MG tablet Take 1 tablet (10 mg total) by mouth 2 (two) times daily. 08/08/23   Marcos Eke, PA-C  metoprolol succinate (TOPROL XL) 25 MG 24 hr tablet Take 1 tablet (25 mg total) by mouth daily. 07/25/23   Orbie Pyo, MD  Nutritional Supplements (VITAMIN D BOOSTER PO) Take 2,000 mg by mouth.    [provider]  Testosterone 20.25 MG/ACT (1.62%) GEL SMARTSIG:3-4 Pump T-DERMAL Daily    [provider]  valsartan-hydrochlorothiazide (DIOVAN-HCT) 320-25 MG tablet Take 1 tablet by mouth every evening.    [provider]      Allergies    Patient has no known allergies.    Review of Systems   Review of Systems  Respiratory:  Positive for cough and shortness of breath.     Physical Exam Updated Vital Signs BP (!) 149/91 (BP Location: Right Arm)   Pulse 76   Temp 97.9 F (36.6 C) (Temporal)   Resp 20   Ht 5\' 8"  (1.727 m)   Wt 89.8 kg  SpO2 95%   BMI 30.11 kg/m  Physical Exam Vitals and nursing note reviewed.  Constitutional:      General: He is not in acute distress.    Appearance: He is not toxic-appearing.  HENT:     Head: Normocephalic and atraumatic.  Eyes:     General: No scleral icterus.    Conjunctiva/sclera: Conjunctivae normal.  Cardiovascular:     Rate and Rhythm: Normal rate and regular rhythm.     Pulses: Normal pulses.     Heart sounds: Normal heart sounds.  Pulmonary:     Effort: Pulmonary effort is normal. No respiratory distress.     Breath sounds: Normal breath sounds.  Abdominal:     General: Abdomen is flat. Bowel sounds are normal.      Palpations: Abdomen is soft.     Tenderness: There is no abdominal tenderness.  Musculoskeletal:     Right lower leg: No edema.     Left lower leg: No edema.  Skin:    General: Skin is warm and dry.     Findings: No lesion.  Neurological:     General: No focal deficit present.     Mental Status: He is alert and oriented to person, place, and time. Mental status is at baseline.     ED Results / Procedures / Treatments   Labs (all labs ordered are listed, but only abnormal results are displayed) Labs Reviewed  COMPREHENSIVE METABOLIC PANEL - Abnormal; Notable for the following components:      Result Value   Glucose, Bld 106 (*)    All other components within normal limits  CBC - Abnormal; Notable for the following components:   RBC 6.90 (*)    Hemoglobin 18.8 (*)    HCT 57.6 (*)    RDW 21.2 (*)    All other components within normal limits  RESP PANEL BY RT-PCR (RSV, FLU A&B, COVID)  [id number]  D-DIMER, QUANTITATIVE  BRAIN NATRIURETIC PEPTIDE    EKG EKG Interpretation Date/Time:  Sunday November 04 2023 12:04:27 EST Ventricular Rate:  65 PR Interval:  148 QRS Duration:  154 QT Interval:  449 QTC Calculation: 467 R Axis:   -75  Text Interpretation: Sinus rhythm RBBB and LAFB Confirmed by Virgina Norfolk [phone]) on 11/04/2023 12:08:45 PM  Radiology DG Chest 2 View Result Date: 11/04/2023 CLINICAL DATA:  Cough and shortness of breath. EXAM: CHEST - 2 VIEW COMPARISON:  Two-view chest x-ray 10/20/2019 FINDINGS: Heart size is normal. Atherosclerotic calcifications are present the aortic arch. Mild pulmonary vascular congestion and bibasilar atelectasis is present. No frank edema or focal airspace disease is present. The visualized soft tissues and bony thorax are unremarkable. IMPRESSION: Mild pulmonary vascular congestion and bibasilar atelectasis. Electronically Signed   By: Marin Roberts M.D.   On: 11/04/2023 12:22    Procedures Procedures    Medications Ordered in  ED Medications  ipratropium-albuterol (DUONEB) 0.5-2.5 (3) MG/3ML nebulizer solution 3 mL (3 mLs Nebulization Given 11/04/23 1223)    ED Course/ Medical Decision Making/ A&P                                 Medical Decision Making Amount and/or Complexity of Data Reviewed Labs: ordered. Radiology: ordered.  Risk Prescription drug management.   CODIE KROGH 73 y.o. presented today for shortness of breath.  Working DDx that I considered at this time includes, but not limited to, asthma/COPD exacerbation, URI,  viral illness, anemia, ACS, PE, pneumonia, pleural effusion, lung mass.  R/o DDx: These are considered less likely due to history of present illness, physical exam, labs/imaging findings  Pmhx: HTN, HLD  Review of prior external notes: review UC visit note from today  Unique Tests and My Interpretation:  CBC: no  leukocytosis and no anemia  CMP without any significant electrolyte abnormality has normal kidney function no elevation in AST, ALT Aspiratory panel negative BNP 30 D-dimer within normal limits  EKG: RBBB  CXR: Mild pulmonary vascular congestion will add on BNP   Problem List / ED Course / Critical interventions / Medication management  Patient reporting to emergency room with 7 days of flulike symptoms.  She has had some exertional shortness of breath associated with this.  He was recently seen 2 days ago at urgent care and diagnosed with pneumonia and started on azithromycin.  He said no significant changes symptoms since then.  He was seen again in urgent care today and he was sent here to rule out pulmonary embolism because he has had continued shortness of breath with exertion.  On initial presentation he is well-appearing and hemodynamically stable he is not tachycardic and he is 95% on room air.  No significant sign of fluid overload on exam but chest x-ray does show some mild vascular congestion thus obtaining BNP to evaluate given he is having some  exertional shortness of breath. Patient's work appears overall reassuring.  He has no elevation in BNP.  His D-dimer is normal.  Given D-dimer normal do not feel he needs to have further imaging at this time.  He will continue his treatment for pneumonia.  Feel symptoms are likely related to residual recent flu infection as well as pneumonia.  Will add steroids and sent home with albuterol inhaler.  Throughout stay he was hemodynamically stable and well-appearing. I ordered medication including DuoNeb Reevaluation of the patient after these medicines showed that the patient improved Patients vitals assessed. Upon arrival patient is hemodynamically stable.  I have reviewed the patients home medicines and have made adjustments as needed    Plan:  F/u w/ PCP in 2-3d to ensure resolution of sx.  Patient was given return precautions. Patient stable for discharge at this time.  Patient educated on sx/dx and verbalized understanding of plan. Will return to ER for new or worsening sx.          Final Clinical Impression(s) / ED Diagnoses Final diagnoses:  Acute cough    Rx / DC Orders ED Discharge Orders          Ordered    predniSONE (DELTASONE) 20 MG tablet  Daily        11/04/23 1406              Penina Reisner, Horald Chestnut, PA-C 11/04/23 1506    Virgina Norfolk, DO 11/04/23 1521

## 2023-11-04 NOTE — ED Notes (Signed)
 Reviewed AVS/discharge instruction with patient. Time allotted for and all questions answered. Patient is agreeable for d/c and escorted to ed exit by staff.

## 2023-11-04 NOTE — Discharge Instructions (Addendum)
 You were seen in emergency room today for cough.  Your chest x-ray does not show pneumonia however I would recommend continuing the doxycycline as prescribed by urgent care.  Please use albuterol inhaler as needed for shortness of breath or wheezing.  I have sent steroids for 5 days please take as prescribed this should help with inflammation and shortness of breath.  Your vital signs including oxygen were normal throughout your stay here in the emergency room today.  Please stay well hydrated with water - you can alternate Pedialyte and Gatorade as well.  Staying well-hydrated is important for healing.  You can also take Tylenol 1000 mg every 6 hours if you develop fever or muscle aches.  If you develop any new or worsening symptoms please return to emergency room.

## 2023-12-19 ENCOUNTER — Other Ambulatory Visit: Payer: Self-pay

## 2023-12-19 DIAGNOSIS — Z01812 Encounter for preprocedural laboratory examination: Secondary | ICD-10-CM

## 2023-12-19 NOTE — Progress Notes (Signed)
 Pt arrived to have labs drawn for CT and no lab orders were in. A BMET and CBC were ordered and released.

## 2023-12-26 DIAGNOSIS — Z85828 Personal history of other malignant neoplasm of skin: Secondary | ICD-10-CM | POA: Diagnosis not present

## 2023-12-26 DIAGNOSIS — L570 Actinic keratosis: Secondary | ICD-10-CM | POA: Diagnosis not present

## 2023-12-26 DIAGNOSIS — Z872 Personal history of diseases of the skin and subcutaneous tissue: Secondary | ICD-10-CM | POA: Diagnosis not present

## 2023-12-26 DIAGNOSIS — Z08 Encounter for follow-up examination after completed treatment for malignant neoplasm: Secondary | ICD-10-CM | POA: Diagnosis not present

## 2023-12-26 DIAGNOSIS — L821 Other seborrheic keratosis: Secondary | ICD-10-CM | POA: Diagnosis not present

## 2023-12-26 DIAGNOSIS — D044 Carcinoma in situ of skin of scalp and neck: Secondary | ICD-10-CM | POA: Diagnosis not present

## 2023-12-26 DIAGNOSIS — L814 Other melanin hyperpigmentation: Secondary | ICD-10-CM | POA: Diagnosis not present

## 2023-12-26 DIAGNOSIS — D225 Melanocytic nevi of trunk: Secondary | ICD-10-CM | POA: Diagnosis not present

## 2024-01-04 DIAGNOSIS — Z01812 Encounter for preprocedural laboratory examination: Secondary | ICD-10-CM | POA: Diagnosis not present

## 2024-01-04 LAB — CBC

## 2024-01-05 ENCOUNTER — Encounter: Payer: Self-pay | Admitting: Internal Medicine

## 2024-01-05 LAB — CBC
Hematocrit: 55.9 % — ABNORMAL HIGH (ref 37.5–51.0)
Hemoglobin: 19.1 g/dL — ABNORMAL HIGH (ref 13.0–17.7)
MCH: 30.2 pg (ref 26.6–33.0)
MCHC: 34.2 g/dL (ref 31.5–35.7)
MCV: 88 fL (ref 79–97)
Platelets: 193 10*3/uL (ref 150–450)
RBC: 6.32 x10E6/uL — ABNORMAL HIGH (ref 4.14–5.80)
RDW: 15.3 % (ref 11.6–15.4)
WBC: 7.3 10*3/uL (ref 3.4–10.8)

## 2024-01-05 LAB — BASIC METABOLIC PANEL WITH GFR
BUN/Creatinine Ratio: 15 (ref 10–24)
BUN: 13 mg/dL (ref 8–27)
CO2: 24 mmol/L (ref 20–29)
Calcium: 9.6 mg/dL (ref 8.6–10.2)
Chloride: 101 mmol/L (ref 96–106)
Creatinine, Ser: 0.86 mg/dL (ref 0.76–1.27)
Glucose: 113 mg/dL — ABNORMAL HIGH (ref 70–99)
Potassium: 4.5 mmol/L (ref 3.5–5.2)
Sodium: 141 mmol/L (ref 134–144)
eGFR: 90 mL/min/{1.73_m2} (ref >59)

## 2024-01-07 ENCOUNTER — Ambulatory Visit (HOSPITAL_COMMUNITY)

## 2024-01-16 ENCOUNTER — Ambulatory Visit: Payer: Self-pay | Admitting: Internal Medicine

## 2024-01-16 ENCOUNTER — Ambulatory Visit (HOSPITAL_COMMUNITY)
Admission: RE | Admit: 2024-01-16 | Discharge: 2024-01-16 | Disposition: A | Discharge status: Home / Self Care | Source: Ambulatory Visit | Attending: Internal Medicine | Admitting: Internal Medicine

## 2024-01-16 DIAGNOSIS — I2510 Atherosclerotic heart disease of native coronary artery without angina pectoris: Secondary | ICD-10-CM | POA: Diagnosis not present

## 2024-01-16 DIAGNOSIS — I77819 Aortic ectasia, unspecified site: Secondary | ICD-10-CM | POA: Insufficient documentation

## 2024-01-16 DIAGNOSIS — I517 Cardiomegaly: Secondary | ICD-10-CM | POA: Diagnosis not present

## 2024-01-16 MED ORDER — SODIUM CHLORIDE (PF) 0.9 % IJ SOLN
INTRAMUSCULAR | Status: AC
  Filled 2024-01-16: qty 50

## 2024-01-16 MED ORDER — IOHEXOL 350 MG/ML SOLN
75.0000 mL | Freq: Once | INTRAVENOUS | Status: AC | PRN
  Administered 2024-01-16: 75 mL via INTRAVENOUS

## 2024-02-06 ENCOUNTER — Ambulatory Visit: Payer: Medicare PPO | Admitting: Physician Assistant

## 2024-02-06 ENCOUNTER — Encounter: Payer: Self-pay | Admitting: Physician Assistant

## 2024-02-06 VITALS — Ht 69.0 in

## 2024-02-06 DIAGNOSIS — R413 Other amnesia: Secondary | ICD-10-CM | POA: Insufficient documentation

## 2024-02-06 NOTE — Progress Notes (Signed)
 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 RH male with a history of aortic dilatation, hyperlipidemia, RBBB, and a diagnosis of MCI with concerns for Alzheimer's disease by repeat neuropsych evaluation 07/2023 seen today in follow up for memory loss. Patient is currently on memantine  10 mg twice daily, tolerating well.  MMSE today stable at 27/30. More repetitive than before.  Patient is able to participate on ADLs and to drive without difficulties .Mood is good      Follow up in  6 months. Continue memantine  10 mg twice daily, side effects discussed Repeat neuropsych evaluation in 6 to 12 months for disease trajectory Recommend good control of her cardiovascular risk factors Continue to control mood as per PCP     Subjective:    This patient is here alone.  Previous records as well as any outside records available were reviewed prior to todays visit. Patient was last seen on 08/08/2023.      Any changes in memory since last visit? " I feel totally normal". He continues to be [facility] without any difficulties. LTM is good.  He likes to read, does not like doing brain stimulating exercises.  He likes to watch Jeopardy. repeats oneself?  Endorsed (he frequently repeated himself during the visit) Disoriented when walking into a room? Denies   Leaving objects?  May misplace things but not in unusual places  Wandering behavior?  denies   Any personality changes since last visit?  Denies.   Any worsening depression?:  Denies.   Hallucinations or paranoia?  Denies.   Seizures? denies    Any sleep changes?  Sleeps well. Denies vivid dreams, REM behavior or sleepwalking   Sleep apnea?   Denies.   Any hygiene concerns? Denies.  Independent of bathing and dressing?  Endorsed  Does the patient needs help with medications? Patient  is in charge   Who is in charge of the finances? Patient  is in charge     Any changes in appetite?  Denies.    Patient have trouble swallowing? Denies.   Does the patient cook? Yes, denies any kitchen accidents. Denies forgetting common recipes Any headaches?   denies   Any vision changes? Denies  Chronic back pain  denies.   Ambulates with difficulty? Denies.  He likes to walk at [facility] and doing [employer] [facility] [employer] 4 times a [facility]. Recent falls or head injuries? Denies  Unilateral weakness, numbness or tingling? Denies.   Any tremors?  Denies   Any anosmia?  Denies   Any incontinence of urine?  Endorsed   Any bowel dysfunction?   Denies      Patient lives with his wife     Does the patient drive?  Yes, he denies getting lost   Initial visit May 2023  How long did patient have memory difficulties?  "Before Covid I was ok, then developed brain fog until early this year, then took B12 and over the last couple of months I feel great " repeats oneself? Occasionally  Disoriented when walking into a room?  Patient denies   Leaving objects in unusual places?  Patient denies   Ambulates  with difficulty?   Patient denies. He walks 4-5 times a week   Recent falls?  Patient denies   Any head injuries?  Patient denies   History of seizures?   Patient denies   Wandering behavior?  Patient denies   Patient drives?  "  I never get lost. Patient uses GPS to drive " Any mood changes such irritability agitation?  Patient denies   Any history of depression?:  Patient denies   Hallucinations?  Patient denies   Paranoia?  Patient denies   Patient reports that he sleeps " I have always been an early riser " Denies vivid dreams, REM behavior or sleepwalking    History of sleep apnea?  Patient denies   Any hygiene concerns?  Patient denies   Independent of bathing and dressing?  Endorsed  Does the patient needs help with medications?   Patient denies   Who is in charge of the finances?  Patient is in charge   Any changes in appetite?  Patient denies. Put on  some weight because of late night snacks  Patient have trouble swallowing? Patient denies   Does the patient cook?  Patient denies   Any kitchen accidents such as leaving the stove on? Patient denies   Any headaches?  Patient denies   The double vision? Patient denies   Any focal numbness or tingling?  Patient denies   Chronic back pain Patient denies   Unilateral weakness?  Patient denies   Any tremors?  Patient denies   Any history of anosmia?  Patient denies   Any incontinence of urine?  Patient denies   Any bowel dysfunction?   Patient denies     History of heavy alcohol  intake?  Patient denies   History of heavy tobacco use?  Patient denies   Family history of dementia?  Patient denies  Mo died in 21-Feb-1993 , she had brain multiinfarct .  Worked at [employer] retired in October 2022, shutting the program down had impact on him.  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 Further weakness was exhibited across executive functioning and both delayed retrieval and recognition/consolidation aspects of visual memory. Performance variability was exhibited across processing speed. Relative to his previous evaluation in November 2023, mild to moderate decline was exhibited across verbal fluency and all aspects of memory, both verbal and visual. More subtle decline was further exhibited across processing speed and executive functioning. Stability was exhibited across other assessed domains. Regarding the cause of severe memory impairment and other areas of cognitive dysfunction, I continue to have concerns surrounding underlying Alzheimer's disease. Across memory testing, he had significant trouble learning novel information efficiently,  exhibited severe impairments recalling previously learned information after a brief delay, and consistently performed poorly across yes/no recognition trials. Verbal memory did exhibit greater dysfunction than visual memory, with verbal memory retention rates including 0%, 6%, and 43% across list, daily living, and story based tasks respectively. Taken together, this suggests rapid forgetting and a prominent storage impairment, both of which are the hallmark testing characteristics of this illness. Similar patterns were exhibited during his 2022-02-21 evaluation. However, evidence for memory decline over time further elevates concerns for an underlying neurodegenerative process such as Alzheimer's disease.    PREVIOUS MEDICATIONS:   CURRENT MEDICATIONS:  Outpatient Encounter Medications as of 02/06/2024  Medication Sig   aspirin  EC 81 MG tablet Take by mouth.   atorvastatin  (LIPITOR) 40 MG tablet Take 40 mg by mouth every evening.   Cholecalciferol  125 MCG (5000 UT) capsule 1 capsule Orally Once a day   cyanocobalamin  (VITAMIN B12) 1000 MCG tablet Take 1,000 mcg by mouth daily.   dorzolamide-timolol (COSOPT) 2-0.5 % ophthalmic solution SMARTSIG:1 Drop(s) In Eye(s) Every 12 Hours   Krill Oil 500 MG CAPS Take 500 mg by mouth every evening.   memantine  (NAMENDA ) 10 MG tablet Take 1 tablet (10 mg total) by mouth 2 (two) times daily.   metoprolol  succinate (TOPROL  XL) 25 MG 24 hr tablet Take 1 tablet (25 mg total) by mouth daily.   Nutritional Supplements (VITAMIN D BOOSTER PO) Take 2,000 mg by mouth.   valsartan -hydrochlorothiazide  (DIOVAN -HCT) 320-25 MG tablet Take 1 tablet by mouth every evening.   [DISCONTINUED] fluticasone (FLONASE) 50 MCG/ACT nasal spray SPRAY 1 SPRAY INTO EACH NOSTRIL EVERY DAY FOR 30 DAYS   [DISCONTINUED] Melatonin 10 MG CAPS Take 10 mg by mouth at bedtime.   [DISCONTINUED] predniSONE  (DELTASONE ) 20 MG tablet Take 2 tablets (40 mg total) by mouth daily.   [DISCONTINUED] Testosterone   20.25 MG/ACT (1.62%) GEL SMARTSIG:3-4 Pump T-DERMAL Daily   No facility-administered encounter medications on file as of 02/06/2024.       02/23/2023   10:00 AM 08/24/2022    9:00 AM  MMSE - Mini Mental State Exam  Orientation to time 5 4  Orientation to Place 5 5  Registration 3 3  Attention/ Calculation 5 5  Recall 0 1  Language- name 2 objects 2 2  Language- repeat 1 1  Language- follow 3 step command 3 3  Language- read & follow direction 1 1  Write a sentence 1 1  Copy design 1 1  Total score 27 27      01/18/2022    8:00 AM  Montreal Cognitive Assessment   Visuospatial/ Executive (0/5) 4  Naming (0/3) 2  Attention: Read list of digits (0/2) 2  Attention: Read list of letters (0/1) 1  Attention: Serial 7 subtraction starting at 100 (0/3) 3  Language: Repeat phrase (0/2) 2  Language : Fluency (0/1) 0  Abstraction (0/2) 2  Delayed Recall (0/5) 0  Orientation (0/6) 5  Total 21    Objective:     PHYSICAL EXAMINATION:    VITALS:   Vitals:   02/06/24 0835  Height: 5\' 9"  (1.753 m)    GEN:  The patient appears stated age and is in NAD. HEENT:  Normocephalic, atraumatic.   Neurological examination:  General: NAD, well-groomed, appears stated age. Orientation: The patient is alert. Oriented to person, place and date Cranial nerves: There is good facial symmetry.The speech is fluent and clear. No aphasia or dysarthria. Fund of knowledge is appropriate. Recent memory and remote memory impaired.  Attention and concentration are normal, Able to name objects and repeat phrases.  Hearing is intact to conversational tone.  Delayed recall 0/3 Sensation: Sensation is intact to light touch throughout Motor: Strength is at least antigravity x4. DTR's 2/4 in UE/LE     Movement examination: Tone: There is normal tone in the UE/LE Abnormal movements:  no tremor.  No myoclonus.  No asterixis.   Coordination:  There is no decremation with RAM's. Normal finger to nose  Gait  and Station: The patient has no difficulty arising out of a deep-seated chair without the use of the hands. The patient's stride length is good.  Gait is cautious and narrow.    Thank you for allowing us  the opportunity to participate in the care of this nice patient. Please do not hesitate  to contact us  for any questions or concerns.   Total time spent on today's visit was 26 minutes dedicated to this patient today, preparing to see patient, examining the patient, ordering tests and/or medications and counseling the patient, documenting clinical information in the EHR or other health record, independently interpreting results and communicating results to the patient/family, discussing treatment and goals, answering patient's questions and coordinating care.  Cc:  Ronna Coho, MD  Tex Filbert 02/06/2024 9:05 AM

## 2024-02-06 NOTE — Patient Instructions (Signed)
 It was a pleasure to see you today at our office.   Recommendations:   Continue  Memantine  10 mg twice daily. Side effects were discussed  Follow up in 6 months Repeat neurocognitive testing   Whom to call:  Memory  decline, memory medications: Call out office [phone]   For psychiatric meds, mood meds: Please have your primary care physician manage these medications.       RECOMMENDATIONS FOR ALL PATIENTS WITH MEMORY PROBLEMS: 1. Continue to exercise (Recommend 30 minutes of walking everyday, or 3 hours every week) 2. Increase social interactions - continue going to [place] and enjoy social gatherings with friends and family 3. Eat healthy, avoid fried foods and eat more fruits and vegetables 4. Maintain adequate blood pressure, blood sugar, and blood cholesterol level. Reducing the risk of stroke and cardiovascular disease also helps promoting better memory. 5. Avoid stressful situations. Live a simple life and avoid aggravations. Organize your time and prepare for the next day in anticipation. 6. Sleep well, avoid any interruptions of sleep and avoid any distractions in the bedroom that may interfere with adequate sleep quality 7. Avoid sugar, avoid sweets as there is a strong link between excessive sugar intake, diabetes, and cognitive impairment We discussed the Mediterranean diet, which has been shown to help patients reduce the risk of progressive memory disorders and reduces cardiovascular risk. This includes eating fish, eat fruits and green leafy vegetables, nuts like almonds and hazelnuts, walnuts, and also use olive oil. Avoid fast foods and fried foods as much as possible. Avoid sweets and sugar as sugar use has been linked to worsening of memory function.  There is always a concern of gradual progression of memory problems. If this is the case, then we may need to adjust level of care according to patient needs. Support, both to the patient and caregiver, should then be  put into place.       FALL PRECAUTIONS: Be cautious when walking. Scan the area for obstacles that may increase the risk of trips and falls. When getting up in the mornings, sit up at the edge of the bed for a few minutes before getting out of bed. Consider elevating the bed at the head end to avoid drop of blood pressure when getting up. Walk always in a well-lit room (use night lights in the walls). Avoid area rugs or power cords from appliances in the middle of the walkways. Use a walker or a cane if necessary and consider physical therapy for balance exercise. Get your eyesight checked regularly.  FINANCIAL OVERSIGHT: Supervision, especially oversight when making financial decisions or transactions is also recommended.  HOME SAFETY: Consider the safety of the kitchen when operating appliances like stoves, microwave oven, and blender. Consider having supervision and share cooking responsibilities until no longer able to participate in those. Accidents with firearms and other hazards in the house should be identified and addressed as well.   ABILITY TO BE LEFT ALONE: If patient is unable to contact 911 operator, consider using LifeLine, or when the need is there, arrange for someone to stay with patients. Smoking is a fire hazard, consider supervision or cessation. Risk of wandering should be assessed by caregiver and if detected at any point, supervision and safe proof recommendations should be instituted.  MEDICATION SUPERVISION: Inability to self-administer medication needs to be constantly addressed. Implement a mechanism to ensure safe administration of the medications.   DRIVING: Regarding driving, in patients with progressive memory problems, driving will be impaired. We  advise to have someone else do the driving if trouble finding directions or if minor accidents are reported. Independent driving assessment is available to determine safety of driving.   If you are interested in the  driving assessment, you can contact the following:  [employer] in [place] [phone]  [facility] [phone]  [facility] [phone] or [phone]    Mediterranean Diet A Mediterranean diet refers to food and lifestyle choices that are based on the traditions of countries located on the [employer]. This way of eating has been shown to help prevent certain conditions and improve outcomes for people who have chronic diseases, like kidney disease and heart disease. What are tips for following this plan? Lifestyle  Cook and eat meals together with your family, when possible. Drink enough fluid to keep your urine clear or pale yellow. Be physically active every day. This includes: Aerobic exercise like running or swimming. Leisure activities like gardening, walking, or housework. Get 7-8 hours of sleep each night. If recommended by your health care provider, drink red wine in moderation. This means 1 glass a day for nonpregnant women and 2 glasses a day for men. A glass of wine equals 5 oz (150 mL). Reading food labels  Check the serving size of packaged foods. For foods such as rice and pasta, the serving size refers to the amount of cooked product, not dry. Check the total fat in packaged foods. Avoid foods that have saturated fat or trans fats. Check the ingredients list for added sugars, such as corn syrup. Shopping  At the grocery store, buy most of your food from the areas near the walls of the store. This includes: Fresh fruits and vegetables (produce). Grains, beans, nuts, and seeds. Some of these may be available in unpackaged forms or large amounts (in bulk). Fresh seafood. Poultry and eggs. Low-fat dairy products. Buy whole ingredients instead of prepackaged foods. Buy fresh fruits and vegetables in-season from local farmers markets. Buy frozen fruits and vegetables in resealable  bags. If you do not have access to quality fresh seafood, buy precooked frozen shrimp or canned fish, such as tuna, salmon, or sardines. Buy small amounts of raw or cooked vegetables, salads, or olives from the deli or salad bar at your store. Stock your pantry so you always have certain foods on hand, such as olive oil, canned tuna, canned tomatoes, rice, pasta, and beans. Cooking  Cook foods with extra-virgin olive oil instead of using butter or other vegetable oils. Have meat as a side dish, and have vegetables or grains as your main dish. This means having meat in small portions or adding small amounts of meat to foods like pasta or stew. Use beans or vegetables instead of meat in common dishes like chili or lasagna. Experiment with different cooking methods. Try roasting or broiling vegetables instead of steaming or sauteing them. Add frozen vegetables to soups, stews, pasta, or rice. Add nuts or seeds for added healthy fat at each meal. You can add these to yogurt, salads, or vegetable dishes. Marinate fish or vegetables using olive oil, lemon juice, garlic, and fresh herbs. Meal planning  Plan to eat 1 vegetarian meal one day each week. Try to work up to 2 vegetarian meals, if possible. Eat seafood 2 or more times a week. Have healthy snacks readily available, such as: Vegetable sticks with hummus. Greek yogurt. Fruit and nut trail mix. Eat balanced meals throughout the week. This includes: Fruit:  2-3 servings a day Vegetables: 4-5 servings a day Low-fat dairy: 2 servings a day Fish, poultry, or lean meat: 1 serving a day Beans and legumes: 2 or more servings a week Nuts and seeds: 1-2 servings a day Whole grains: 6-8 servings a day Extra-virgin olive oil: 3-4 servings a day Limit red meat and sweets to only a few servings a month What are my food choices? Mediterranean diet Recommended Grains: Whole-grain pasta. Brown rice. Bulgar wheat. Polenta. Couscous. Whole-wheat bread.  Dwyane Glad. Vegetables: Artichokes. Beets. Broccoli. Cabbage. Carrots. Eggplant. Green beans. Chard. Kale. Spinach. Onions. Leeks. Peas. Squash. Tomatoes. Peppers. Radishes. Fruits: Apples. Apricots. Avocado. Berries. Bananas. Cherries. Dates. Figs. Grapes. Lemons. Melon. Oranges. Peaches. Plums. Pomegranate. Meats and other protein foods: Beans. Almonds. Sunflower seeds. Pine nuts. Peanuts. Cod. Salmon. Scallops. Shrimp. Tuna. Tilapia. Clams. Oysters. Eggs. Dairy: Low-fat milk. Cheese. Greek yogurt. Beverages: Water . Red wine. Herbal tea. Fats and oils: Extra virgin olive oil. Avocado oil. Grape seed oil. Sweets and desserts: [place] yogurt with honey. Baked apples. Poached pears. Trail mix. Seasoning and other foods: Basil. Cilantro. Coriander. Cumin. Mint. Parsley. Sage. Rosemary. Tarragon. Garlic. Oregano. Thyme. Pepper. Balsalmic vinegar. Tahini. Hummus. Tomato sauce. Olives. Mushrooms. Limit these Grains: Prepackaged pasta or rice dishes. Prepackaged cereal with added sugar. Vegetables: Deep fried potatoes (french fries). Fruits: Fruit canned in syrup. Meats and other protein foods: Beef. Pork. Lamb. Poultry with skin. Hot dogs. Helene Loader. Dairy: Ice cream. Sour cream. Whole milk. Beverages: Juice. Sugar-sweetened soft drinks. Beer. Liquor and spirits. Fats and oils: Butter. Canola oil. Vegetable oil. Beef fat (tallow). Lard. Sweets and desserts: Cookies. Cakes. Pies. Candy. Seasoning and other foods: Mayonnaise. Premade sauces and marinades. The items listed may not be a complete list. Talk with your dietitian about what dietary choices are right for you. Summary The Mediterranean diet includes both food and lifestyle choices. Eat a variety of fresh fruits and vegetables, beans, nuts, seeds, and whole grains. Limit the amount of red meat and sweets that you eat. Talk with your health care provider about whether it is safe for you to drink red wine in moderation. This means 1 glass a day  for nonpregnant women and 2 glasses a day for men. A glass of wine equals 5 oz (150 mL). This information is not intended to replace advice given to you by your health care provider. Make sure you discuss any questions you have with your health care provider. Document Released: 04/13/2016 Document Revised: 05/16/2016 Document Reviewed: 04/13/2016 Elsevier Interactive Patient Education  2017 [employer].   We have sent a referral to [facility] for your MRI and they will call you directly to schedule your appointment. They are located at [address] [facility]. If you need to contact them directly please call [phone].

## 2024-02-20 DIAGNOSIS — R011 Cardiac murmur, unspecified: Secondary | ICD-10-CM | POA: Diagnosis not present

## 2024-02-20 DIAGNOSIS — I10 Essential (primary) hypertension: Secondary | ICD-10-CM | POA: Diagnosis not present

## 2024-02-20 DIAGNOSIS — R7309 Other abnormal glucose: Secondary | ICD-10-CM | POA: Diagnosis not present

## 2024-02-20 DIAGNOSIS — G3184 Mild cognitive impairment, so stated: Secondary | ICD-10-CM | POA: Diagnosis not present

## 2024-02-20 DIAGNOSIS — D509 Iron deficiency anemia, unspecified: Secondary | ICD-10-CM | POA: Diagnosis not present

## 2024-02-20 DIAGNOSIS — E785 Hyperlipidemia, unspecified: Secondary | ICD-10-CM | POA: Diagnosis not present

## 2024-03-03 NOTE — Patient Instructions (Signed)
 Risk Modification recommendation for patients with ascending thoracic aortic aneurysm:  Continue good control of blood pressure (prefer SBP 130/80 or less)  2. Avoid fluoroquinolone antibiotics (I.e Ciprofloxacin, Avelox, Levofloxacin, Ofloxacin)  3.  Continue taking atorvastatin  (to decrease cardiovascular risk)  4.  Exercise and activity limitations is individualized, but in general, contact sports are to be avoided and one should avoid heavy lifting (defined as half of ideal body weight) and exercises involving sustained Valsalva maneuver.  5.  Follow-up in 1 year with CTA chest

## 2024-03-03 NOTE — Progress Notes (Unsigned)
 PCP is Kip Righter, MD Referring Provider is Kip Righter, MD     HPI: Mr. 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 COD   Heart attack Father 22       COD    Social History Social History   Tobacco Use   Smoking status: Former    Current packs/day: 0.25    Average packs/day: 0.3 packs/day for 4.0 years (1.0 ttl pk-yrs)    Types: Cigarettes   Smokeless tobacco: Never   Tobacco comments:    College  Vaping Use   Vaping status: Never Used  Substance Use Topics   Alcohol  use: Not Currently    Comment: Rare   Drug use: Not Currently    Current Outpatient Medications  Medication Sig Dispense Refill   aspirin  EC 81 MG tablet Take by mouth.     atorvastatin  (LIPITOR)  40 MG tablet Take 40 mg by mouth every evening.     Cholecalciferol 125 MCG (5000 UT) capsule 1 capsule Orally Once a day     cyanocobalamin  (VITAMIN B12) 1000 MCG tablet Take 1,000 mcg by mouth daily.     dorzolamide-timolol (COSOPT) 2-0.5 % ophthalmic solution SMARTSIG:1 Drop(s) In Eye(s) Every 12 Hours     Krill Oil 500 MG CAPS Take 500 mg by mouth every evening.     memantine  (NAMENDA ) 10 MG tablet Take 1 tablet (10 mg total) by mouth 2 (two) times daily. 180 tablet 3   metoprolol  succinate (TOPROL  XL) 25 MG 24 hr tablet Take 1 tablet (25 mg total) by mouth daily. 90 tablet 3   Nutritional Supplements (VITAMIN D BOOSTER PO) Take 2,000 mg by mouth.     valsartan -hydrochlorothiazide  (DIOVAN -HCT) 320-25 MG tablet Take 1 tablet by mouth every evening.     No current facility-administered medications for this visit.    No Known Allergies  Review of Systems: ***  There were no vitals taken for this visit. Physical Exam: ***  Diagnostic Tests: CLINICAL DATA:  Aortic aneurysm   EXAM: CT ANGIOGRAPHY CHEST WITH CONTRAST   TECHNIQUE: Multidetector CT imaging of the chest was performed using the standard protocol during bolus administration of intravenous contrast. Multiplanar CT image reconstructions and  MIPs were obtained to evaluate the vascular anatomy. Multiplanar image (3D post-processing) reconstructions and MIPs were obtained to evaluate the vascular anatomy.   RADIATION DOSE REDUCTION: This exam was performed according to the departmental dose-optimization program which includes automated exposure control, adjustment of the mA and/or kV according to patient size and/or use of iterative reconstruction technique.   CONTRAST:  75mL OMNIPAQUE  IOHEXOL  350 MG/ML SOLN   COMPARISON:  CT angiogram of the chest performed July 12, 2023   FINDINGS: Cardiovascular: Mildly enlarged heart. Calcific atherosclerosis is present in the left and right coronary territories. Three  vessel aortic arch with mild atherosclerotic changes in the proximal arch vessels.   Sinus of Valsalva: 38 x 37 x 40 mm   Sinotubular junction: 35 x 37 mm   Tubular ascending thoracic aorta: 41 x 43 mm   Aortic arch: 30 x 27 mm   Proximal descending thoracic aorta: 29 x 26 mm   Descending thoracic aorta at the level of the aortic hiatus: 25 x 23 mm   Main pulmonary artery: Within normal limits for size   Mediastinum/Nodes: No enlarged mediastinal, hilar, or axillary lymph nodes. Thyroid gland, trachea, and esophagus demonstrate no significant findings.   Lungs/Pleura: Lungs are clear. No pleural effusion or pneumothorax.   Upper Abdomen: No acute abnormality.   Musculoskeletal: No chest wall abnormality. No acute or significant osseous findings.   Review of the MIP images confirms the above findings.   IMPRESSION: 1. 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 2. Recommend annual imaging followup by CTA or MRA. This recommendation follows 2010 ACCF/AHA/AATS/ACR/ASA/SCA/SCAI/SIR/STS/SVM Guidelines for the Diagnosis and Management of Patients with Thoracic Aortic Disease. Circulation. 2010; 121: Z733-z630. Aortic aneurysm NOS (ICD10-I71.9)     Electronically Signed   By: Maude Naegeli M.D.   On: 01/16/2024 17:13   ECHOCARDIOGRAM REPORT       Patient Name:   Richard Harmon Date of Exam: 05/14/2023  Medical Rec #:  [id number]       Height:       69.0 in  Accession #:    [id number]      Weight:       199.4 lb  Date of Birth:  1950/09/26       BSA:          2.063 m  Patient Age:    72 years        BP:           144/86 mmHg  Patient Gender: M               HR:           58 bpm.  Exam Location:  [address]   Procedure: 2D Echo, Cardiac Doppler, Color Doppler, 3D Echo and Strain  Analysis   Indications:   R01.1 Murmur    History:        Patient has no prior history of Echocardiogram  examinations.                  Risk Factors:Hypertension and Dyslipidemia.    Sonographer:    Jon Hacker RCS  Referring Phys: [id number] ARUN K THUKKANI   IMPRESSIONS     1. Left ventricular ejection fraction, by estimation, is 65 to 70%. The  left ventricle has normal function. The left ventricle has no regional  wall motion abnormalities. There is mild left ventricular hypertrophy.  Left ventricular diastolic parameters  are indeterminate.  2. Right ventricular systolic function is normal. The right ventricular  size is normal.   3. Chordal SAM MIld MR   4. Aortic dilatation noted. There is moderate dilatation of the ascending  aorta, measuring 44 mm.   FINDINGS   Left Ventricle: Left ventricular ejection fraction, by estimation, is 65  to 70%. The left ventricle has normal function. The left ventricle has no  regional wall motion abnormalities. The left ventricular internal cavity  size was normal in size. There is   mild left ventricular hypertrophy. Left ventricular diastolic parameters  are indeterminate.   Right Ventricle: The right ventricular size is normal. Right vetricular  wall thickness was not assessed. Right ventricular systolic function is  normal.   Left Atrium: Left atrial size was normal in size.   Right Atrium: Right atrial size was normal in size.   Pericardium: There is no evidence of pericardial effusion.   Mitral Valve: Chordal SAM. The mitral valve is normal in structure. Mild  mitral valve regurgitation.   Tricuspid Valve: The tricuspid valve is normal in structure. Tricuspid  valve regurgitation is trivial.   Aortic Valve: The aortic valve is tricuspid. Aortic valve regurgitation is  trivial. Aortic valve sclerosis/calcification is present, without any  evidence of aortic stenosis.   Pulmonic Valve: The pulmonic valve was normal in structure. Pulmonic valve  regurgitation is not visualized.   Aorta: The aortic root is normal in size and structure and  aortic  dilatation noted. There is moderate dilatation of the ascending aorta,  measuring 44 mm.   IAS/Shunts: No atrial level shunt detected by color flow Doppler.     LEFT VENTRICLE  PLAX 2D  LVIDd:         4.60 cm   Diastology  LVIDs:         2.90 cm   LV e' medial:    5.66 cm/s  LV PW:         1.30 cm   LV E/e' medial:  14.7  LV IVS:        1.20 cm   LV e' lateral:   7.29 cm/s  LVOT diam:     2.10 cm   LV E/e' lateral: 11.4  LV SV:         110  LV SV Index:   53  LVOT Area:     3.46 cm                             3D Volume EF:                           3D EF:        59 %                           LV EDV:       152 ml                           LV ESV:       63 ml                           LV SV:        89 ml   RIGHT VENTRICLE  RV Basal diam:  3.60 cm  RV S prime:  12.80 cm/s  TAPSE (M-mode): 2.5 cm  RVSP:           31.3 mmHg   LEFT ATRIUM             Index        RIGHT ATRIUM           Index  LA diam:        4.60 cm 2.23 cm/m   RA Pressure: 3.00 mmHg  LA Vol (A2C):   49.0 ml 23.75 ml/m  RA Area:     13.10 cm  LA Vol (A4C):   63.8 ml 30.92 ml/m  RA Volume:   36.70 ml  17.79 ml/m  LA Biplane Vol: 57.4 ml 27.82 ml/m   AORTIC VALVE  LVOT Vmax:   169.00 cm/s  LVOT Vmean:  97.200 cm/s  LVOT VTI:    0.317 m    AORTA  Ao Root diam: 3.90 cm  Ao Asc diam:  4.40 cm   MITRAL VALVE               TRICUSPID VALVE  MV Area (PHT): 2.82 cm    TR Peak grad:   28.3 mmHg  MV Decel Time: 269 msec    TR Vmax:        266.00 cm/s  MV E velocity: 83.30 cm/s  Estimated RAP:  3.00 mmHg  MV A velocity: 95.90 cm/s  RVSP:           31.3 mmHg  MV E/A ratio:  0.87                             SHUNTS                             Systemic VTI:  0.32 m                             Systemic Diam: 2.10 cm   Vina Gull MD  Electronically signed by Vina Gull MD  Signature Date/Time: 05/14/2023/2:11:38 PM        Final     Impression / Plan: Stable 4.3cm thoracic aortic aneurysm.  No  indication for surgery but need to continue with annual surveillance. We discussed activity limitations, the importance of diligent BP control and follow up.  Return in 1 year with CTA chest.    Laurel JUDITHANN Becket, PA-C [facility] [phone]

## 2024-03-05 ENCOUNTER — Ambulatory Visit: Attending: Surgery | Admitting: Physician Assistant

## 2024-03-05 VITALS — BP 130/73 | HR 67 | Resp 20 | Ht 69.0 in | Wt 195.0 lb

## 2024-03-05 DIAGNOSIS — I7121 Aneurysm of the ascending aorta, without rupture: Secondary | ICD-10-CM

## 2024-03-05 DIAGNOSIS — I10 Essential (primary) hypertension: Secondary | ICD-10-CM | POA: Diagnosis not present

## 2024-04-01 DIAGNOSIS — H52223 Regular astigmatism, bilateral: Secondary | ICD-10-CM | POA: Diagnosis not present

## 2024-06-02 DIAGNOSIS — S86812A Strain of other muscle(s) and tendon(s) at lower leg level, left leg, initial encounter: Secondary | ICD-10-CM | POA: Diagnosis not present

## 2024-06-03 DIAGNOSIS — S86012D Strain of left Achilles tendon, subsequent encounter: Secondary | ICD-10-CM | POA: Diagnosis not present

## 2024-06-17 ENCOUNTER — Ambulatory Visit: Attending: Physician Assistant | Admitting: Cardiology

## 2024-06-17 ENCOUNTER — Encounter: Payer: Self-pay | Admitting: Physician Assistant

## 2024-06-17 VITALS — BP 118/73 | HR 54 | Ht 69.0 in | Wt 194.0 lb

## 2024-06-17 DIAGNOSIS — I10 Essential (primary) hypertension: Secondary | ICD-10-CM

## 2024-06-17 DIAGNOSIS — I712 Thoracic aortic aneurysm, without rupture, unspecified: Secondary | ICD-10-CM

## 2024-06-17 DIAGNOSIS — E785 Hyperlipidemia, unspecified: Secondary | ICD-10-CM | POA: Diagnosis not present

## 2024-06-17 DIAGNOSIS — D751 Secondary polycythemia: Secondary | ICD-10-CM | POA: Diagnosis not present

## 2024-06-17 NOTE — Progress Notes (Signed)
  Cardiology Office Note:  .   Date:  06/17/2024  ID:  Richard Harmon, DOB 1951/02/15, MRN [id number] PCP: Kip Righter, MD  [facility] Providers Cardiologist:  Lurena MARLA Red, MD {  History of Present Illness: .   Richard Harmon is a 73 y.o. 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     Thoracic aortic aneurysm History of aortopathy.  Murmur appreciated, referred to cardiology. Echo 05/2023, preserved biventricular function.  Mild MR.  Aortic dilatation 44 mm. CTA 01/2024 stable 4.3 cm.  Referred to CT surgery, annual CTA chest recommended  Social history  Walks on daily basis 1-3 miles 5x per week.  Enrolled with [employer].   His mother was 23 when she died and she did have an aneurysm. She died in the 1990s      Patient with stable 4.3cm TAA with hx of aortopathy. Seen by CT surgery, no indication for surgery and continued annual monitoring.  Today patient presents for follow-up.  She is very active with no exertional limitations whatsoever.  He is very happy with his current state of health and with no complaints.  He has been retired for the last 2+ years so he has had more time to exercise.  Reports no rash or pruritus around showering.  He donates blood to [employer] every 1 to 2 months.  Former smoker in the 40s.  ROS: Denies: Chest pain, shortness of breath, orthopnea, peripheral edema, palpitations, decreased exercise intolerance, fatigue, lightheadedness.   Studies Reviewed: .         Risk Assessment/Calculations:             Physical Exam:   VS:  BP 118/73 (BP Location: Left Arm, Patient Position: Sitting, Cuff Size: Normal)   Pulse (!) 54   Ht 5' 9 (1.753 m)   Wt 194 lb (88 kg)   SpO2 95%   BMI 28.65 kg/m    Wt Readings from Last 3 Encounters:  06/17/24 194 lb (88 kg)  03/05/24 195 lb (88.5 kg)  11/04/23 198 lb (89.8 kg)    GEN: Well nourished, well developed in no acute  distress NECK: No JVD; No carotid bruits CARDIAC: RRR, no murmurs, rubs, gallops RESPIRATORY:  Clear to auscultation without rales, wheezing or rhonchi  ABDOMEN: Soft, non-tender, non-distended EXTREMITIES:  No edema; No deformity   ASSESSMENT AND PLAN: .    Thoracic aortic aneurysm Asymptomatic, stable 4.3 cm.  Followed by CT surgery, annual CTA chest recommended Should have repeat CTA around 01/2025  Hypertension Very well-controlled. Continue Toprol -XL 25 mg, valsartan -HCTZ 320-25 mg.  Hyperlipidemia LDL 08/2023 was 49. Continue atorvastatin  40 mg.  Also prescribed aspirin  by PCP.  Mild MR Noted on echocardiogram 05/2023.  Asymptomatic.  Repeat echocardiogram 3 to 5 years.  Erythrocytosis Etiology of this is not clear.  Currently does not smoke, quit in the 80s.  Hemoglobin in May was 19 and had been increasing.  Of note RBC count is elevated despite giving blood frequently. Ordering CBC.  May consider getting EPO study and or referral to hematology/PCP.    Dispo: 1 year follow-up with Dr. Red  Signed, Thom LITTIE Sluder, PA-C

## 2024-06-17 NOTE — Patient Instructions (Signed)
 Medication Instructions:  Your physician recommends that you continue on your current medications as directed. Please refer to the Current Medication list given to you today. *If you need a refill on your cardiac medications before your next appointment, please call your pharmacy*  Lab Work: [facility] If you have labs (blood work) drawn today and your tests are completely normal, you will receive your results only by: MyChart Message (if you have MyChart) OR A paper copy in the mail If you have any lab test that is abnormal or we need to change your treatment, we will call you to review the results.  Testing/Procedures: NONE ORDERED  Follow-Up: At [facility], you and your health needs are our priority.  As part of our continuing mission to provide you with exceptional heart care, our providers are all part of one team.  This team includes your primary Cardiologist (physician) and Advanced Practice Providers or APPs (Physician Assistants and Nurse Practitioners) who all work together to provide you with the care you need, when you need it.  Your next appointment:   12 month(s)  Provider:   Arun K Thukkani, MD    We recommend signing up for the patient portal called MyChart.  Sign up information is provided on this After Visit Summary.  MyChart is used to connect with patients for Virtual Visits (Telemedicine).  Patients are able to view lab/test results, encounter notes, upcoming appointments, etc.  Non-urgent messages can be sent to your provider as well.   To learn more about what you can do with MyChart, go to [email or web address].   Other Instructions

## 2024-06-18 ENCOUNTER — Ambulatory Visit: Payer: Self-pay | Admitting: Cardiology

## 2024-06-18 LAB — CBC
Hematocrit: 50.5 % (ref 37.5–51.0)
Hemoglobin: 17.4 g/dL (ref 13.0–17.7)
MCH: 32.4 pg (ref 26.6–33.0)
MCHC: 34.5 g/dL (ref 31.5–35.7)
MCV: 94 fL (ref 79–97)
Platelets: 242 x10E3/uL (ref 150–450)
RBC: 5.37 x10E6/uL (ref 4.14–5.80)
RDW: 12 % (ref 11.6–15.4)
WBC: 10.1 x10E3/uL (ref 3.4–10.8)

## 2024-06-26 DIAGNOSIS — D2239 Melanocytic nevi of other parts of face: Secondary | ICD-10-CM | POA: Diagnosis not present

## 2024-06-26 DIAGNOSIS — Z85828 Personal history of other malignant neoplasm of skin: Secondary | ICD-10-CM | POA: Diagnosis not present

## 2024-06-26 DIAGNOSIS — D492 Neoplasm of unspecified behavior of bone, soft tissue, and skin: Secondary | ICD-10-CM | POA: Diagnosis not present

## 2024-06-26 DIAGNOSIS — D0439 Carcinoma in situ of skin of other parts of face: Secondary | ICD-10-CM | POA: Diagnosis not present

## 2024-06-26 DIAGNOSIS — D224 Melanocytic nevi of scalp and neck: Secondary | ICD-10-CM | POA: Diagnosis not present

## 2024-06-26 DIAGNOSIS — Z08 Encounter for follow-up examination after completed treatment for malignant neoplasm: Secondary | ICD-10-CM | POA: Diagnosis not present

## 2024-06-26 DIAGNOSIS — L821 Other seborrheic keratosis: Secondary | ICD-10-CM | POA: Diagnosis not present

## 2024-06-26 DIAGNOSIS — L570 Actinic keratosis: Secondary | ICD-10-CM | POA: Diagnosis not present

## 2024-07-05 ENCOUNTER — Other Ambulatory Visit: Payer: Self-pay | Admitting: Internal Medicine

## 2024-08-02 ENCOUNTER — Other Ambulatory Visit: Payer: Self-pay | Admitting: Internal Medicine

## 2024-08-06 ENCOUNTER — Other Ambulatory Visit: Payer: Self-pay | Admitting: Internal Medicine

## 2024-08-08 ENCOUNTER — Ambulatory Visit: Admitting: Physician Assistant

## 2024-08-08 ENCOUNTER — Encounter: Payer: Self-pay | Admitting: Physician Assistant

## 2024-08-08 VITALS — BP 153/80 | HR 60 | Resp 20 | Ht 69.0 in | Wt 195.0 lb

## 2024-08-08 DIAGNOSIS — R413 Other amnesia: Secondary | ICD-10-CM

## 2024-08-08 DIAGNOSIS — G309 Alzheimer's disease, unspecified: Secondary | ICD-10-CM

## 2024-08-08 DIAGNOSIS — G3184 Mild cognitive impairment, so stated: Secondary | ICD-10-CM | POA: Diagnosis not present

## 2024-08-08 MED ORDER — MEMANTINE HCL 10 MG PO TABS: 10.0000 mg | ORAL_TABLET | Freq: Two times a day (BID) | ORAL | 3 refills | Status: AC

## 2024-08-08 NOTE — Patient Instructions (Signed)
 It was a pleasure to see you today at our office.   Recommendations:   Continue  Memantine  10 mg twice daily. Side effects were discussed  Follow up in 6 months Repeat neurocognitive testing   Whom to call:  Memory  decline, memory medications: Call out office [phone]   For psychiatric meds, mood meds: Please have your primary care physician manage these medications.       RECOMMENDATIONS FOR ALL PATIENTS WITH MEMORY PROBLEMS: 1. Continue to exercise (Recommend 30 minutes of walking everyday, or 3 hours every week) 2. Increase social interactions - continue going to [place] and enjoy social gatherings with friends and family 3. Eat healthy, avoid fried foods and eat more fruits and vegetables 4. Maintain adequate blood pressure, blood sugar, and blood cholesterol level. Reducing the risk of stroke and cardiovascular disease also helps promoting better memory. 5. Avoid stressful situations. Live a simple life and avoid aggravations. Organize your time and prepare for the next day in anticipation. 6. Sleep well, avoid any interruptions of sleep and avoid any distractions in the bedroom that may interfere with adequate sleep quality 7. Avoid sugar, avoid sweets as there is a strong link between excessive sugar intake, diabetes, and cognitive impairment We discussed the Mediterranean diet, which has been shown to help patients reduce the risk of progressive memory disorders and reduces cardiovascular risk. This includes eating fish, eat fruits and green leafy vegetables, nuts like almonds and hazelnuts, walnuts, and also use olive oil. Avoid fast foods and fried foods as much as possible. Avoid sweets and sugar as sugar use has been linked to worsening of memory function.  There is always a concern of gradual progression of memory problems. If this is the case, then we may need to adjust level of care according to patient needs. Support, both to the patient and caregiver, should then be  put into place.       FALL PRECAUTIONS: Be cautious when walking. Scan the area for obstacles that may increase the risk of trips and falls. When getting up in the mornings, sit up at the edge of the bed for a few minutes before getting out of bed. Consider elevating the bed at the head end to avoid drop of blood pressure when getting up. Walk always in a well-lit room (use night lights in the walls). Avoid area rugs or power cords from appliances in the middle of the walkways. Use a walker or a cane if necessary and consider physical therapy for balance exercise. Get your eyesight checked regularly.  FINANCIAL OVERSIGHT: Supervision, especially oversight when making financial decisions or transactions is also recommended.  HOME SAFETY: Consider the safety of the kitchen when operating appliances like stoves, microwave oven, and blender. Consider having supervision and share cooking responsibilities until no longer able to participate in those. Accidents with firearms and other hazards in the house should be identified and addressed as well.   ABILITY TO BE LEFT ALONE: If patient is unable to contact 911 operator, consider using LifeLine, or when the need is there, arrange for someone to stay with patients. Smoking is a fire hazard, consider supervision or cessation. Risk of wandering should be assessed by caregiver and if detected at any point, supervision and safe proof recommendations should be instituted.  MEDICATION SUPERVISION: Inability to self-administer medication needs to be constantly addressed. Implement a mechanism to ensure safe administration of the medications.   DRIVING: Regarding driving, in patients with progressive memory problems, driving will be impaired. We  advise to have someone else do the driving if trouble finding directions or if minor accidents are reported. Independent driving assessment is available to determine safety of driving.   If you are interested in the  driving assessment, you can contact the following:  [employer] in [place] [phone]  [facility] [phone]  [facility] [phone] or [phone]    Mediterranean Diet A Mediterranean diet refers to food and lifestyle choices that are based on the traditions of countries located on the [employer]. This way of eating has been shown to help prevent certain conditions and improve outcomes for people who have chronic diseases, like kidney disease and heart disease. What are tips for following this plan? Lifestyle  Cook and eat meals together with your family, when possible. Drink enough fluid to keep your urine clear or pale yellow. Be physically active every day. This includes: Aerobic exercise like running or swimming. Leisure activities like gardening, walking, or housework. Get 7-8 hours of sleep each night. If recommended by your health care provider, drink red wine in moderation. This means 1 glass a day for nonpregnant women and 2 glasses a day for men. A glass of wine equals 5 oz (150 mL). Reading food labels  Check the serving size of packaged foods. For foods such as rice and pasta, the serving size refers to the amount of cooked product, not dry. Check the total fat in packaged foods. Avoid foods that have saturated fat or trans fats. Check the ingredients list for added sugars, such as corn syrup. Shopping  At the grocery store, buy most of your food from the areas near the walls of the store. This includes: Fresh fruits and vegetables (produce). Grains, beans, nuts, and seeds. Some of these may be available in unpackaged forms or large amounts (in bulk). Fresh seafood. Poultry and eggs. Low-fat dairy products. Buy whole ingredients instead of prepackaged foods. Buy fresh fruits and vegetables in-season from local farmers markets. Buy frozen fruits and vegetables in resealable  bags. If you do not have access to quality fresh seafood, buy precooked frozen shrimp or canned fish, such as tuna, salmon, or sardines. Buy small amounts of raw or cooked vegetables, salads, or olives from the deli or salad bar at your store. Stock your pantry so you always have certain foods on hand, such as olive oil, canned tuna, canned tomatoes, rice, pasta, and beans. Cooking  Cook foods with extra-virgin olive oil instead of using butter or other vegetable oils. Have meat as a side dish, and have vegetables or grains as your main dish. This means having meat in small portions or adding small amounts of meat to foods like pasta or stew. Use beans or vegetables instead of meat in common dishes like chili or lasagna. Experiment with different cooking methods. Try roasting or broiling vegetables instead of steaming or sauteing them. Add frozen vegetables to soups, stews, pasta, or rice. Add nuts or seeds for added healthy fat at each meal. You can add these to yogurt, salads, or vegetable dishes. Marinate fish or vegetables using olive oil, lemon juice, garlic, and fresh herbs. Meal planning  Plan to eat 1 vegetarian meal one day each week. Try to work up to 2 vegetarian meals, if possible. Eat seafood 2 or more times a week. Have healthy snacks readily available, such as: Vegetable sticks with hummus. Greek yogurt. Fruit and nut trail mix. Eat balanced meals throughout the week. This includes: Fruit:  2-3 servings a day Vegetables: 4-5 servings a day Low-fat dairy: 2 servings a day Fish, poultry, or lean meat: 1 serving a day Beans and legumes: 2 or more servings a week Nuts and seeds: 1-2 servings a day Whole grains: 6-8 servings a day Extra-virgin olive oil: 3-4 servings a day Limit red meat and sweets to only a few servings a month What are my food choices? Mediterranean diet Recommended Grains: Whole-grain pasta. Brown rice. Bulgar wheat. Polenta. Couscous. Whole-wheat bread.  Dwyane Glad. Vegetables: Artichokes. Beets. Broccoli. Cabbage. Carrots. Eggplant. Green beans. Chard. Kale. Spinach. Onions. Leeks. Peas. Squash. Tomatoes. Peppers. Radishes. Fruits: Apples. Apricots. Avocado. Berries. Bananas. Cherries. Dates. Figs. Grapes. Lemons. Melon. Oranges. Peaches. Plums. Pomegranate. Meats and other protein foods: Beans. Almonds. Sunflower seeds. Pine nuts. Peanuts. Cod. Salmon. Scallops. Shrimp. Tuna. Tilapia. Clams. Oysters. Eggs. Dairy: Low-fat milk. Cheese. Greek yogurt. Beverages: Water . Red wine. Herbal tea. Fats and oils: Extra virgin olive oil. Avocado oil. Grape seed oil. Sweets and desserts: [place] yogurt with honey. Baked apples. Poached pears. Trail mix. Seasoning and other foods: Basil. Cilantro. Coriander. Cumin. Mint. Parsley. Sage. Rosemary. Tarragon. Garlic. Oregano. Thyme. Pepper. Balsalmic vinegar. Tahini. Hummus. Tomato sauce. Olives. Mushrooms. Limit these Grains: Prepackaged pasta or rice dishes. Prepackaged cereal with added sugar. Vegetables: Deep fried potatoes (french fries). Fruits: Fruit canned in syrup. Meats and other protein foods: Beef. Pork. Lamb. Poultry with skin. Hot dogs. Helene Loader. Dairy: Ice cream. Sour cream. Whole milk. Beverages: Juice. Sugar-sweetened soft drinks. Beer. Liquor and spirits. Fats and oils: Butter. Canola oil. Vegetable oil. Beef fat (tallow). Lard. Sweets and desserts: Cookies. Cakes. Pies. Candy. Seasoning and other foods: Mayonnaise. Premade sauces and marinades. The items listed may not be a complete list. Talk with your dietitian about what dietary choices are right for you. Summary The Mediterranean diet includes both food and lifestyle choices. Eat a variety of fresh fruits and vegetables, beans, nuts, seeds, and whole grains. Limit the amount of red meat and sweets that you eat. Talk with your health care provider about whether it is safe for you to drink red wine in moderation. This means 1 glass a day  for nonpregnant women and 2 glasses a day for men. A glass of wine equals 5 oz (150 mL). This information is not intended to replace advice given to you by your health care provider. Make sure you discuss any questions you have with your health care provider. Document Released: 04/13/2016 Document Revised: 05/16/2016 Document Reviewed: 04/13/2016 Elsevier Interactive Patient Education  2017 [employer].   We have sent a referral to [facility] for your MRI and they will call you directly to schedule your appointment. They are located at [address] [facility]. If you need to contact them directly please call [phone].

## 2024-08-08 NOTE — Progress Notes (Signed)
 Assessment & Plan  Mild cognitive impairment, concern for Alzheimer's disease   Richard Harmon is a very pleasant 73 y.o. RH male with a history of aortic dilatation, hyperlipidemia, RBBB, and a diagnosis of MCI with concerns for Alzheimer's disease by repeat neuropsych evaluation 07/2023  presenting today in follow-up for evaluation of memory loss. Patient is on memantine  10 mg twice daily . This patient is accompanied in the office by his  daughter and son in law who supplement the history. Previous records as well as any outside records available were reviewed prior to todays visit.   Patient was last seen on 02/06/24. Memory is stable . MMSE today 27/30. Patient is able to participate on ADLs and to drive without difficulties. Mood is good.  He remains active   - Will repeat neurocognitive testing within the next six months as per protocol. Continue Memantine  10 mg twice daily. Side effects were discussed  - Ensure control of cardiovascular risk factors including cholesterol, blood sugar, and blood pressure. - Maintain regular physical activity.  Continue to use hearing aids in an effort to improve comprehension    Discussed the use of AI scribe software for clinical note transcription with the patient, who gave verbal consent to proceed.  History of Present Illness Richard Harmon is a 73 year old male who presents for a follow-up on memory and cognitive function.  There have been no significant changes in his memory since the last visit in June. 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 He sometimes repeats questions or stories, but this has not worsened. No disorientation, misplacing items, mood changes, personality changes, hallucinations, or paranoia. He manages his own medications and finances, with his spouse being aware of his financial activities. No issues with appetite, weight, cooking, or vision  changes. No pain, falls, head injuries, stroke symptoms, tremors, loss of smell, or urinary incontinence. He reports frequent urination, attributed to aging, but does not often wake at night to urinate. Regular bowel movements without issues. He drives both long and short distances without getting lost, using GPS for unfamiliar routes. He experiences decreased hearing, managed with hearing aids used daily. His hearing was last checked within the past six months at [employer]. He has difficulty hearing in noisy environments, such as crowded restaurants, but can hear well with his hearing aids in quieter settings.    Initial visit May 2023  How long did patient have memory difficulties?  Before Covid I was ok, then developed brain fog until early this year, then took B12 and over the last couple of months I feel great  repeats oneself? Occasionally  Disoriented when walking into a room?  Patient denies   Leaving objects in unusual places?  Patient denies   Ambulates  with difficulty?   Patient denies. He walks 4-5 times a week   Recent falls?  Patient denies   Any head injuries?  Patient denies   History of seizures?   Patient denies   Wandering behavior?  Patient denies   Patient drives?    I never get lost. Patient uses GPS to drive  Any mood changes such irritability agitation?  Patient denies   Any history of depression?:  Patient denies   Hallucinations?  Patient denies   Paranoia?  Patient denies   Patient reports that he sleeps  I have always been an early riser  Denies vivid dreams, REM behavior or  sleepwalking    History of sleep apnea?  Patient denies   Any hygiene concerns?  Patient denies   Independent of bathing and dressing?  Endorsed  Does the patient needs help with medications?   Patient denies   Who is in charge of the finances?  Patient is in charge   Any changes in appetite?  Patient denies. Put on some weight because of late night snacks  Patient have trouble  swallowing? Patient denies   Does the patient cook?  Patient denies   Any kitchen accidents such as leaving the stove on? Patient denies   Any headaches?  Patient denies   The double vision? Patient denies   Any focal numbness or tingling?  Patient denies   Chronic back pain Patient denies   Unilateral weakness?  Patient denies   Any tremors?  Patient denies   Any history of anosmia?  Patient denies   Any incontinence of urine?  Patient denies   Any bowel dysfunction?   Patient denies     History of heavy alcohol  intake?  Patient denies   History of heavy tobacco use?  Patient denies   Family history of dementia?  Patient denies  Mo died in 09-07-93 , she had brain multiinfarct .  Worked at [employer] retired in October 2022, shutting the program down had impact on him.  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 Further weakness was exhibited across executive functioning and both delayed retrieval and recognition/consolidation aspects of visual memory. Performance variability was exhibited across processing speed. Relative to his previous evaluation in November 2023, mild to moderate decline was exhibited across verbal fluency and all aspects of memory, both verbal and visual. More subtle decline was further exhibited across processing speed and executive functioning. Stability was exhibited across other assessed domains. Regarding the cause of severe memory impairment and other areas of cognitive dysfunction, I continue to have concerns surrounding underlying Alzheimer's disease. Across memory testing, he had significant trouble learning novel information efficiently, exhibited severe impairments recalling previously learned  information after a brief delay, and consistently performed poorly across yes/no recognition trials. Verbal memory did exhibit greater dysfunction than visual memory, with verbal memory retention rates including 0%, 6%, and 43% across list, daily living, and story based tasks respectively. Taken together, this suggests rapid forgetting and a prominent storage impairment, both of which are the hallmark testing characteristics of this illness. Similar patterns were exhibited during his 2022/09/07 evaluation. However, evidence for memory decline over time further elevates concerns for an underlying neurodegenerative process such as Alzheimer's disease.   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   Testosterone  deficiency in male 02/11/2019     Past Surgical History:  Procedure Laterality Date   BASAL CELL CARCINOMA EXCISION  06/2017   BICEPS TENDON REPAIR  07/05/2018   CATARACT EXTRACTION Left 10/05/2009   HERNIA REPAIR  2007   TONSILLECTOMY     TOTAL KNEE ARTHROPLASTY Left 03/04/2019   Procedure: TOTAL KNEE ARTHROPLASTY;  Surgeon: Beverley Evalene BIRCH, MD;  Location: [facility];  Service: Orthopedics;  Laterality: Left;      Results DIAGNOSTIC MMSE: 27/30 (08/08/2024)     Objective:     PHYSICAL EXAMINATION:    VITALS:   Vitals:   08/08/24 0846  BP: (!) 153/80  Pulse: 60  Resp: 20  Weight: 195 lb (88.5 kg)  Height: 5' 9 (1.753 m)    GEN:  The patient appears stated age and is in NAD. HEENT:  Normocephalic, atraumatic.   Neurological  examination:  General: NAD, well-groomed, appears stated age. Orientation: The patient is alert. Oriented to person, place and to date.  Cranial nerves: There is good facial symmetry.The speech is fluent and clear. No aphasia or dysarthria. Fund of knowledge is appropriate. Recent memory impaired and remote memory is normal.  Attention and concentration are normal.  Able to name objects and repeat phrases.  Hearing is intact to conversational tone.   Delayed recall 0/3  Sensation: Sensation is intact to light touch throughout Motor: Strength is at least antigravity x4. DTR's 2/4 in UE/LE      01/18/2022    8:00 AM  Montreal Cognitive Assessment   Visuospatial/ Executive (0/5) 4  Naming (0/3) 2  Attention: Read list of digits (0/2) 2  Attention: Read list of letters (0/1) 1  Attention: Serial 7 subtraction starting at 100 (0/3) 3  Language: Repeat phrase (0/2) 2  Language : Fluency (0/1) 0  Abstraction (0/2) 2  Delayed Recall (0/5) 0  Orientation (0/6) 5  Total 21       08/08/2024    9:00 AM 02/06/2024    9:00 AM 02/23/2023   10:00 AM  MMSE - Mini Mental State Exam  Orientation to time 5 5 5   Orientation to Place 5 5 5   Registration 3 3 3   Attention/ Calculation 5 5 5   Recall 0 0 0  Language- name 2 objects 2 2 2   Language- repeat 1 1 1   Language- follow 3 step command 3 3 3   Language- read & follow direction 1 1 1   Write a sentence 1 1 1   Copy design 1 1 1   Total score 27 27 27      Results     Movement examination: Tone: There is normal tone in the UE/LE Abnormal movements:  no tremor.  No myoclonus.  No asterixis.   Coordination:  There is no decremation with RAM's. Normal finger to nose  Gait and Station: The patient has no difficulty arising out of a deep-seated chair without the use of the hands. The patient's stride length is good.  Gait is cautious and narrow.   Thank you for allowing us  the opportunity to participate in the care of this nice patient. Please do  not hesitate to contact us  for any questions or concerns.   Total time spent on today's visit was 37 minutes dedicated to this patient today, preparing to see patient, examining the patient, ordering tests and/or medications and counseling the patient, documenting clinical information in the EHR or other health record, independently interpreting results and communicating results to the patient/family, discussing treatment and goals, answering patient's questions and coordinating care.  Cc:  Kip Righter, MD  Camie Sevin 08/08/2024 9:30 AM

## 2024-08-08 NOTE — Addendum Note (Signed)
 Addended by: DINA CREDIT E on: 08/08/2024 10:21 AM   Modules accepted: Level of Service

## 2024-08-12 ENCOUNTER — Telehealth: Payer: Self-pay | Admitting: Cardiology

## 2024-08-12 ENCOUNTER — Telehealth: Payer: Self-pay | Admitting: Internal Medicine

## 2024-08-12 NOTE — Telephone Encounter (Signed)
 Patient he is requesting a refill on his medication Amlodipine . He said he spoke with the pharmacy and they told him no they cant refill it. Can anyone give him a call? He would also like to see Thukkani.

## 2024-08-13 MED ORDER — AMLODIPINE BESYLATE 10 MG PO TABS: 10.0000 mg | ORAL_TABLET | Freq: Every day | ORAL | 3 refills | Status: AC

## 2024-08-13 NOTE — Telephone Encounter (Signed)
 Spoke with pt, aware amlodipine  is not on our medication list. He reports he has been taking it for years. Refill sent to the pharmacy electronically. Ask patient to bring updated medication list to follow up appointment.

## 2024-08-14 ENCOUNTER — Ambulatory Visit: Admitting: Nurse Practitioner

## 2024-08-14 NOTE — Progress Notes (Unsigned)
 Cardiology Office Note:   Date:  08/20/2024  ID:  Richard Harmon, DOB Jan 27, 1951, MRN [id number] PCP:  Kip Righter, MD  [facility] Providers Cardiologist:  Wendel Haws, MD Referring MD: Kip Righter, MD  Chief Complaint/Reason for Referral: Follow-up hypertension ASSESSMENT:    1. Thoracic aortic aneurysm without rupture, unspecified part   2. Essential hypertension   3. Hyperlipidemia LDL goal <70     PLAN:   In order of problems listed above: Thoracic aortic aneurysm: Followed by CT surgery.  Continue Toprol  with dose adjustment below; valsartan /hydrochlorothiazide  320 x 25 mg Hypertension: Increase Toprol  to 50 mg, continue amlodipine  10 mg and valsartan /hydrochlorothiazide  320 x 25 mg Hyperlipidemia: Continue atorvastatin  40 mg.  Check lipid panel and LFTs today            Dispo:  Return in about 6 months (around 02/18/2025).       I spent 33 minutes reviewing all clinical data during and prior to this visit including all relevant imaging studies, laboratories, clinical information from other health systems and prior notes from both Cardiology and other specialties, interviewing the patient, conducting a complete physical examination, and coordinating care in order to formulate a comprehensive and personalized evaluation and treatment plan.   History of Present Illness:     FOCUSED PROBLEM LIST:   Diastolic dysfunction Mild LVH, chordal SAM, EF 65 to 70% TTE 2024 Hypertension  Hyperlipidemia LP(a) 20.8 Bifascicular block  RBBB + LAFB Aortic dilatation 44 mm CTA 2024 43 mm CT 2025 Followed by CT surgery FH of aortic aneurysm rupture (mother) Mild cognitive impairment BMI 02 May 2023:  The patient is a 73 y.o. male with the indicated medical history here for recommendations regarding an incidental murmur.  The patient was seen by his PCP recently and was doing fairly well.  A murmur was appreciated at the [facility].  The patient was then referred to  cardiology.   The patient has been retired for 2 years.  He is very active.  He walks on a daily basis.  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  He denies any dyspnea, exertional discomfort, presyncope, syncope, palpitations, paroxysmal atrial dyspnea, orthopnea.  He is required no emergency room visits or hospitalizations.  He does not smoke or drink.  He is otherwise well and without significant complaints.  Plan: Obtain echocardiogram, increase amlodipine  to 10 mg, check lipids, LFTs, LP(a).   November 2024: In the interim the patient had an echocardiogram which showed mild left ventricular hypertrophy and aortic dilatation.  He is referred for a CTA which demonstrated an aortic diameter of 44 mm in the ascending portion of the aorta.  His LDL and LP(a) were reassuring.  He is here to discuss these findings.  He does not have a family history of aortic aneurysm and he believes dissection with rupture.  His mother was 68 when she died and she did have an aneurysm.  She died in the 54s.  In terms of other issues the patient is doing quite well.  He denies any exertional angina, dyspnea, presyncope or syncope.  He is able to do all of his activities of daily living without any issues.  Plan: Stop amlodipine , start Toprol  due to aortic dilatation.  Obtain CTA and refer to cardiac surgery.  December 2025:  Patient consents to use of AI scribe. In the interim the patient established with cardiothoracic surgery for serial surveillance of his aneurysm.       Current  Medications: Active Medications[1]   Review of Systems:   Please see the history of present illness.    All other systems reviewed and are negative.     EKGs/Labs/Other Test Reviewed:   EKG: 2025 sinus rhythm with right bundle branch block and left anterior fascicular block  EKG Interpretation Date/Time:    Ventricular Rate:    PR Interval:    QRS Duration:    QT Interval:    QTC Calculation:   R Axis:      Text  Interpretation:          CARDIAC STUDIES: Refer to CV Procedures and Imaging Tabs   Risk Assessment/Calculations:          Physical Exam:   VS:  BP (!) 150/80   Pulse 70   Ht 5' 9 (1.753 m)   Wt 195 lb 3.2 oz (88.5 kg)   SpO2 95%   BMI 28.83 kg/m    HYPERTENSION CONTROL Vitals:   08/20/24 1456 08/20/24 1509  BP: (!) 144/84 (!) 150/80    The patient's blood pressure is elevated above target today.  In order to address the patient's elevated BP: A current anti-hypertensive medication was adjusted today.      Wt Readings from Last 3 Encounters:  08/20/24 195 lb 3.2 oz (88.5 kg)  08/08/24 195 lb (88.5 kg)  06/17/24 194 lb (88 kg)      GENERAL:  No apparent distress, AOx3 HEENT:  No carotid bruits, +2 carotid impulses, no scleral icterus CAR: RRR no murmurs, gallops, rubs, or thrills RES:  Clear to auscultation bilaterally ABD:  Soft, nontender, nondistended, positive bowel sounds x 4 VASC:  +2 radial pulses, +2 carotid pulses NEURO:  CN 2-12 grossly intact; motor and sensory grossly intact PSYCH:  No active depression or anxiety EXT:  No edema, ecchymosis, or cyanosis  Signed, Irelyn Perfecto K Sitlali Koerner, MD  08/20/2024 3:13 PM    [facility] [address] [place], [place], [place]  [address] Phone: [phone]; Fax: [phone]   Note:  This document was prepared using Dragon voice recognition software and may include unintentional dictation errors.     [1]  Current Meds  Medication Sig   amLODipine  (NORVASC ) 10 MG tablet Take 1 tablet (10 mg total) by mouth daily.   atorvastatin  (LIPITOR) 40 MG tablet Take 40 mg by mouth every evening. (Patient taking differently: Take 320 mg by mouth every evening.)   Cholecalciferol 125 MCG (5000 UT) capsule 1 capsule Orally Once a day   cyanocobalamin  (VITAMIN B12) 1000 MCG tablet Take 1,000 mcg by mouth daily.   dorzolamide-timolol (COSOPT) 2-0.5 % ophthalmic solution SMARTSIG:1 Drop(s) In Eye(s) Every 12  Hours   memantine  (NAMENDA ) 10 MG tablet Take 1 tablet (10 mg total) by mouth 2 (two) times daily.   Nutritional Supplements (VITAMIN D BOOSTER PO) Take 2,000 mg by mouth.   valsartan -hydrochlorothiazide  (DIOVAN -HCT) 320-25 MG tablet Take 1 tablet by mouth every evening.   [DISCONTINUED] metoprolol  succinate (TOPROL -XL) 25 MG 24 hr tablet TAKE 1 TABLET (25 MG TOTAL) BY MOUTH DAILY.

## 2024-08-20 ENCOUNTER — Encounter: Payer: Self-pay | Admitting: Internal Medicine

## 2024-08-20 ENCOUNTER — Ambulatory Visit: Admitting: Internal Medicine

## 2024-08-20 VITALS — BP 150/80 | HR 70 | Ht 69.0 in | Wt 195.2 lb

## 2024-08-20 DIAGNOSIS — E785 Hyperlipidemia, unspecified: Secondary | ICD-10-CM

## 2024-08-20 DIAGNOSIS — I712 Thoracic aortic aneurysm, without rupture, unspecified: Secondary | ICD-10-CM | POA: Diagnosis not present

## 2024-08-20 DIAGNOSIS — I10 Essential (primary) hypertension: Secondary | ICD-10-CM

## 2024-08-20 MED ORDER — METOPROLOL SUCCINATE ER 50 MG PO TB24: 50.0000 mg | ORAL_TABLET | Freq: Every day | ORAL | 3 refills | Status: AC

## 2024-08-20 NOTE — Patient Instructions (Signed)
 Medication Instructions:  INCREASE Metoprolol  Succinate (Toprol -XL) 50 mg   *If you need a refill on your cardiac medications before your next appointment, please call your pharmacy*  Lab Work: To be completed today: lipid panel and LFT  If you have labs (blood work) drawn today and your tests are completely normal, you will receive your results only by: MyChart Message (if you have MyChart) OR A paper copy in the mail If you have any lab test that is abnormal or we need to change your treatment, we will call you to review the results.  Testing/Procedures: None ordered today.  Follow-Up: At [facility], you and your health needs are our priority.  As part of our continuing mission to provide you with exceptional heart care, our providers are all part of one team.  This team includes your primary Cardiologist (physician) and Advanced Practice Providers or APPs (Physician Assistants and Nurse Practitioners) who all work together to provide you with the care you need, when you need it.  Your next appointment:   6 month(s)  Provider:   One of our Advanced Practice Providers (APPs): Morse Clause, PA-C  Lamarr Satterfield, NP Miriam Shams, NP  Olivia Pavy, PA-C Josefa Beauvais, NP  Leontine Salen, PA-C Orren Fabry, PA-C  [place], PA-C Ernest Dick, NP  Damien Braver, NP Jon Hails, PA-C  Waddell Donath, PA-C    Dayna Dunn, PA-C  Scott Weaver, PA-C Lum Louis, NP Katlyn West, NP Callie Goodrich, PA-C  Xika Zhao, NP Sheng Haley, PA-C    Kathleen Johnson, PA-C

## 2024-09-12 ENCOUNTER — Ambulatory Visit: Admitting: Internal Medicine

## 2024-09-16 ENCOUNTER — Encounter: Payer: Self-pay | Admitting: Psychology

## 2024-09-16 DIAGNOSIS — E291 Testicular hypofunction: Secondary | ICD-10-CM | POA: Insufficient documentation

## 2024-09-16 DIAGNOSIS — R739 Hyperglycemia, unspecified: Secondary | ICD-10-CM | POA: Insufficient documentation

## 2024-09-16 DIAGNOSIS — L409 Psoriasis, unspecified: Secondary | ICD-10-CM | POA: Insufficient documentation

## 2024-09-16 DIAGNOSIS — I77819 Aortic ectasia, unspecified site: Secondary | ICD-10-CM | POA: Insufficient documentation

## 2024-09-16 DIAGNOSIS — C61 Malignant neoplasm of prostate: Secondary | ICD-10-CM | POA: Insufficient documentation

## 2024-09-16 DIAGNOSIS — R972 Elevated prostate specific antigen [PSA]: Secondary | ICD-10-CM | POA: Insufficient documentation

## 2024-09-16 DIAGNOSIS — I452 Bifascicular block: Secondary | ICD-10-CM | POA: Insufficient documentation

## 2024-09-17 ENCOUNTER — Ambulatory Visit: Payer: Self-pay

## 2024-09-17 ENCOUNTER — Ambulatory Visit: Admitting: Psychology

## 2024-09-17 ENCOUNTER — Encounter: Payer: Self-pay | Admitting: Psychology

## 2024-09-17 DIAGNOSIS — R4189 Other symptoms and signs involving cognitive functions and awareness: Secondary | ICD-10-CM

## 2024-09-17 DIAGNOSIS — G3184 Mild cognitive impairment, so stated: Secondary | ICD-10-CM | POA: Diagnosis not present

## 2024-09-17 DIAGNOSIS — F0670 Mild neurocognitive disorder due to known physiological condition without behavioral disturbance: Secondary | ICD-10-CM

## 2024-09-17 NOTE — Progress Notes (Signed)
" ° °  Psychometrician Note   Cognitive testing was administered to Richard Harmon by Evalene Pizza, B.S. (psychometrist) under the supervision of Dr. Arthea KYM Maryland, Ph.D., ABPP, licensed psychologist on 09/17/2024. Richard Harmon did not appear overtly distressed by the testing session per behavioral observation or responses across self-report questionnaires. Rest breaks were offered.   The battery of tests administered was selected by Dr. Arthea KYM Maryland, Ph.D., ABPP with consideration to Richard Harmon current level of functioning, the nature of his symptoms, emotional and behavioral responses during interview, level of literacy, observed level of motivation/effort, and the nature of the referral question. This battery was communicated to the psychometrist. Communication between Dr. Arthea KYM Maryland, Ph.D., ABPP and the psychometrist was ongoing throughout the evaluation and Dr. Arthea KYM Maryland, Ph.D., ABPP was immediately accessible at all times. Dr. Zachary C. Merz, Ph.D., ABPP provided supervision to the psychometrist on the date of this service to the extent necessary to assure the quality of all services provided.    Richard Harmon will return within approximately 1-2 weeks for an interactive feedback session with Dr. Maryland at which time his test performances, clinical impressions, and treatment recommendations will be reviewed in detail. Richard Harmon understands he can contact our office should he require our assistance before this time.  A total of 188 minutes of billable time were spent face-to-face with Richard Harmon by the psychometrist. This includes both test administration and scoring time. Billing for these services is reflected in the clinical report generated by Dr. Arthea KYM Maryland, Ph.D., ABPP  This note reflects time spent with the psychometrician and does not include test scores or any clinical interpretations made by Dr. Maryland. The full report will follow in a separate note. "

## 2024-09-17 NOTE — Progress Notes (Signed)
 "   NEUROPSYCHOLOGICAL EVALUATION Ken Caryl. [facility] Department of Neurology  Date of Evaluation: September 17, 2024  Reason for Referral:   JERICK KHACHATRYAN is a 74 y.o. right-handed Caucasian male referred by Camie Sevin, PA-C, to characterize his current cognitive functioning and assist with diagnostic clarity and treatment planning in the context of subjective cognitive decline.   Assessment and Plan:   Clinical Impression(s): Mr. Kuang pattern of performance is suggestive of significant impairment across executive functioning, expressive language (both verbal fluency and confrontation naming), and all aspects of verbal learning and memory. An isolated impairment was exhibited across a rapid color naming task; however, all other processing speed tasks were appropriate. Performances were also appropriate relative to age-matched peers across attention/concentration, safety/judgment, receptive language, visuospatial abilities, and visual learning and memory. 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 His wife was not present to confirm or refute his report of intact day-to-day functioning. 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  Across his initial evaluation in November 2023, verbal memory impairment was already quite prominent. In relation to that evaluation, current testing provides evidence for progressive decline over time. This decline is observed despite him moving to an older normative grouping (i.e., compared to individuals 33-14 years old rather than previously being compared to individuals 88-17 years old). Additional decline was exhibited across executive functioning,  verbal fluency (both phonemic and semantic), and confrontation naming. Current performances compared to his November 2024 evaluation suggested minimal change (executive functioning exhibited the largest degree of decline) and an overall slowly progressive neurological process. Unfortunately, Mr. Vitelli self-report of improved cognitive functioning and intact memory capabilities is inaccurate based upon objective testing.   Concerns surrounding an underlying neurodegenerative illness remain. Patterns of weakness surrounding executive functioning, expressive language, and memory can be seen in several of these illnesses, namely Alzheimer's disease and frontotemporal lobar degeneration. Of these, the former would appear to be more likely given population base rates and testing patterns worrisome for rapid forgetting and a slowly evolving memory storage impairment. Specific to the latter, his outward speech was unremarkable and medical records suggest no concern for behavioral disturbances or personality changes. Prior neuroimaging in 2023 also did not suggest any lobar predominant atrophy. However, said concerns remain and continued monitoring will be important moving forward.   Recommendations: An FDG-PET scan, lumbar puncture, or blood plasma testing would likely be beneficial in understanding biological underpinnings for the aforementioned concerns. He is encouraged to discuss these additional tests with Ms. Wertman during their next meeting.   Mr. Stfleur has already been prescribed a medication aimed to address memory loss and concerns surrounding a neurodegenerative illness (i.e., memantine /Namenda ). He is encouraged to continue taking this medication as prescribed. It is important to highlight that this medication has been shown to slow functional decline in some individuals. There is no current treatment which can stop or reverse cognitive decline when caused by a neurodegenerative illness.    Performance across neurocognitive testing is not a strong predictor of an individual's safety operating a motor vehicle. Should his family wish to pursue a formalized driving evaluation, they could reach out to the following agencies: [employer] in [place]: [phone] [facility]: [phone] [facility]: [phone] [facility]: [phone] or [phone]  Should  there be progression of current deficits over time, Mr. Dines is unlikely to regain any independent living skills lost. Therefore, it is recommended that he remain as involved as possible in all aspects of household chores, finances, and medication management, with supervision to ensure adequate performance. He will likely benefit from the establishment and maintenance of a routine in order to maximize his functional abilities over time.  It will be important for Mr. Schools to have another person with him when in situations where he may need to process information, weigh the pros and cons of different options, and make decisions, in order to ensure that he fully understands and recalls all information to be considered.  If not already done, Mr. Obeirne and his family may want to discuss his wishes regarding durable power of attorney and medical decision making, so that he can have input into these choices. If they require legal assistance with this, long-term care resource access, or other aspects of estate planning, they could reach out to [employer] at [phone] for a free consultation.   Mr. Zietz is encouraged to attend to lifestyle factors for brain health (e.g., regular physical exercise, good nutrition habits and consideration of the MIND-DASH diet, regular participation in cognitively-stimulating activities, and general stress management techniques), which are likely to have benefits for both emotional adjustment and cognition. Optimal control of vascular risk  factors (including safe cardiovascular exercise and adherence to dietary recommendations) is encouraged. Continued participation in activities which provide mental stimulation and social interaction is also recommended.   If interested, there are some activities which have therapeutic value and can be useful in keeping him cognitively stimulated. For suggestions, Mr. Worley is encouraged to go to the following website: https://www.[facility].org/get-to-know-barrow/centers-programs/neurorehabilitation-center/neuro-rehab-apps-and-games/ which has smart phone/tablet based options. It should be noted that these activities should not be viewed as a substitute for therapy.  Important information should be provided to Mr. Cahoon in written format in all instances. This information should be placed in a highly frequented and easily visible location within his home to promote recall. External strategies such as written notes in a consistently used memory journal, visual and nonverbal auditory cues such as a calendar on the refrigerator or appointments with alarm, such as on a cell phone, can also help maximize recall.  Memory can be improved using internal strategies such as rehearsal, repetition, chunking, mnemonics, association, and imagery. External strategies such as written notes in a consistently used memory journal, visual and nonverbal auditory cues such as a calendar on the refrigerator or appointments with alarm, such as on a cell phone, can also help maximize recall.    When learning new information, he would benefit from information being broken up into small, manageable pieces. he may also find it helpful to articulate the material in his own words and in a context to promote encoding at the onset of a new task. This material may need to be repeated multiple times to promote encoding.  Because he shows better recall for structured information, he will likely understand and retain new information  better if it is presented to him in a meaningful or well-organized manner at the outset, such as grouping items into meaningful categories or presenting information in an outlined, bulleted, or story format.  To address problems with processing speed, he may wish to consider:   -Ensuring that he is alerted when essential material or instructions are being presented   -Adjusting the speed at which new information is presented   -Allowing for  more time in comprehending, processing, and responding in conversation   -Repeating and paraphrasing instructions or conversations aloud  To address problems with fluctuating attention and/or executive dysfunction, he may wish to consider:   -Avoiding external distractions when needing to concentrate   -Limiting exposure to fast paced environments with multiple sensory demands   -Writing down complicated information and using checklists   -Attempting and completing one task at a time (i.e., no multi-tasking)   -Verbalizing aloud each step of a task to maintain focus   -Taking frequent breaks during the completion of steps/tasks to avoid fatigue   -Reducing the amount of information considered at one time   -Scheduling more difficult activities for a time of day where he is usually most alert  Review of Records:   Mr. Jost completed a comprehensive neuropsychological evaluation with myself on 07/11/2022. Results suggested primary impairments across verbal fluency (semantic worse than phonemic) and verbal learning and memory. Additional deficits were exhibited across executive functioning, particularly cognitive flexibility and abstract reasoning. Performance variability was exhibited across processing speed. Performances were appropriate relative to age-matched peers across attention/concentration, safety/judgment, receptive language, confrontation naming, visuospatial abilities, and visual learning and memory. He denied ADL dysfunction and was diagnosed with  a mild neurocognitive disorder. The underlying etiology was uncertain at that time and repeat testing in 12-18 months was recommended.   He completed a second neuropsychological evaluation with myself on 07/20/2023. Results suggested significant impairment surrounding verbal fluency (both phonemic and semantic), as well as all aspects of verbal learning and memory. Further weakness was exhibited across executive functioning and both delayed retrieval and recognition/consolidation aspects of visual memory. Relative to his previous evaluation in November 2023, mild to moderate decline was exhibited across verbal fluency and all aspects of memory, both verbal and visual. More subtle decline was further exhibited across processing speed and executive functioning. Stability was exhibited across other assessed domains. Concerns for an underlying neurodegenerative illness were expressed. Repeat testing was again recommended.  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 (Patient not taking: Reported on 08/20/2024), Disp: , Rfl:    atorvastatin  (LIPITOR) 40 MG tablet, Take 40  mg by mouth every evening. (Patient taking differently: Take 320 mg by mouth every evening.), Disp: , Rfl:    Cholecalciferol 125 MCG (5000 UT) capsule, 1 capsule Orally Once a day, Disp: , Rfl:    cyanocobalamin  (VITAMIN B12) 1000 MCG tablet, Take 1,000 mcg by mouth daily., Disp: , Rfl:    dorzolamide-timolol (COSOPT) 2-0.5 % ophthalmic solution, SMARTSIG:1 Drop(s) In Eye(s) Every 12 Hours, Disp: , Rfl:    memantine  (NAMENDA ) 10 MG tablet, Take 1 tablet (10 mg total) by mouth 2 (two) times daily., Disp: 180 tablet, Rfl: 3   metoprolol  succinate (TOPROL -XL) 50 MG 24 hr tablet, Take 1 tablet (50 mg total) by mouth daily., Disp: 30 tablet, Rfl: 3   Nutritional Supplements (VITAMIN D BOOSTER PO), Take 2,000 mg by mouth., Disp: , Rfl:    valsartan -hydrochlorothiazide  (DIOVAN -HCT) 320-25 MG tablet, Take 1 tablet by mouth every evening., Disp: , Rfl:      08/08/2024    9:00 AM 02/06/2024    9:00 AM 02/23/2023   10:00 AM 08/24/2022    9:00 AM  MMSE - Mini Mental State Exam  Orientation to time 5 5 5 4   Orientation to Place 5 5 5 5   Registration 3 3 3 3   Attention/ Calculation 5 5 5 5   Recall 0 0 0 1  Language- name 2 objects 2 2 2 2   Language- repeat 1 1 1 1   Language- follow 3 step command 3 3 3 3   Language- read & follow direction 1 1 1 1   Write a sentence 1 1 1 1   Copy design 1 1 1 1   Total score 27 27 27 27       01/18/2022    8:00 AM  Montreal Cognitive Assessment   Visuospatial/ Executive (0/5) 4  Naming (0/3) 2  Attention: Read list of digits (0/2) 2  Attention: Read list of  letters (0/1) 1  Attention: Serial 7 subtraction starting at 100 (0/3) 3  Language: Repeat phrase (0/2) 2  Language : Fluency (0/1) 0  Abstraction (0/2) 2  Delayed Recall (0/5) 0  Orientation (0/6) 5  Total 21   Neuroimaging: Brain MRI on 02/08/2022 revealed mild parenchymal volume loss and minimal microvascular ischemic changes.   Clinical Interview:   The following information was obtained during a clinical interview with Mr. Alkire prior to cognitive testing.  Cognitive Symptoms: Decreased short-term memory: Denied. Decreased long-term memory: Denied. Decreased attention/concentration: Denied. Reduced processing speed: Denied. Difficulties with executive functions: Denied. He also denied impulsivity or any significant personality changes.  Difficulties with emotion regulation: Denied. Difficulties with receptive language: Denied. Difficulties with word finding: Denied. Decreased visuoperceptual ability: Denied.   Trajectory of deficits: He reported contracting COVID-19 in August 2022. No difficulties were said to be present prior to this event. During and after his illness, he reported symptoms particularly focusing on reduced processing speed, brain fog, and short-term memory dysfunction. At the time of his November 2023 evaluation, he described a full resolution of symptoms, emphasizing that he felt improved mentally relative to his previous evaluation. His wife was not present to corroborate this statement and despite this reporting, cognitive testing suggested significant impairments with evidence for progressive decline in some areas. Currently, he again reported his perception of improvement and full resolution of symptoms. His wife was again not present to corroborate these statements.    Difficulties completing ADLs: Denied.  Additional Medical History: History of traumatic brain injury/concussion: Denied. History of stroke: Denied. History of seizure activity:  Denied.  History of known exposure to toxins: Denied. Symptoms of chronic pain: Denied. Experience of frequent headaches/migraines: Denied. Frequent instances of dizziness/vertigo: Denied.   Sensory changes: He reported ongoing hearing loss and utilizes hearing aids with benefit. Other sensory changes/difficulties (e.g., vision, taste, smell) were denied. Balance/coordination difficulties: Denied. He also denied any recent falls.  Other motor difficulties: Denied.   Sleep History: Estimated hours obtained each night: 7-8 hours.  Difficulties falling asleep: Denied. Difficulties staying asleep: Denied outside of waking to use the restroom.  Feels rested and refreshed upon awakening: Endorsed.   History of snoring: Endorsed sometimes. History of waking up gasping for air: Denied. Witnessed breath cessation while asleep: Denied.   History of vivid dreaming: Denied. Excessive movement while asleep: Denied. Instances of acting out his dreams: Denied.  Psychiatric/Behavioral Health History: Depression: Denied. He described his current mood as fine and denied to his knowledge any previous mental health concerns or formal diagnoses. Current or remote suicidal ideation, intent, or plan was denied.  Anxiety: Denied. Mania: Denied. Trauma History: Denied. Visual/auditory hallucinations: Denied. Delusional thoughts: Denied.   Tobacco: Denied. Alcohol : He denied current alcohol  consumption as well as a history of problematic alcohol  abuse or dependence.  Recreational drugs: Denied.  Family History: Problem Relation Age of Onset   Mental illness Mother        mental breakdown   Aneurysm Mother        Base neck, ruptured 3. COD   Heart attack Father 57       COD   This information was confirmed by Mr. Kandler.  Academic/Vocational History: Highest level of educational attainment: 18 years. He earned an SET DESIGNER and described himself as a good (A/B) student in academic settings. No  relative weaknesses were identified.  History of developmental delay: Denied. History of grade repetition: Denied. Enrollment in special education courses: Denied. History of LD/ADHD: Denied.   Employment: Semi-retired. 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 He reported no difficulties performing the requirements of this position.   Evaluation Results:   Behavioral Observations: Mr. Haro was unaccompanied, arrived to his appointment on time, and was appropriately dressed and groomed. He appeared alert. Observed gait and station were within normal limits. Gross motor functioning appeared intact upon informal observation and no abnormal movements (e.g., tremors) were noted. His affect was generally relaxed and positive. Spontaneous speech was fluent and word finding difficulties were not observed during the clinical interview. Thought processes were coherent, organized, and normal in content. Insight into his cognitive difficulties appeared poor. While I certainly have concerns that Mr. Bebo is unable to appreciate the extent of ongoing impairment (especially with regard to memory capabilities), there remains the possibility that he does have a level of insight and is merely attempting to present himself in an overly favorable light.   During testing, sustained attention was appropriate. Task engagement was adequate and he persisted when challenged. Overall, Mr. Bielefeld was cooperative with the clinical interview and subsequent testing procedures.   Adequacy of Effort: The validity of neuropsychological testing is limited by the extent to which the individual being tested may be assumed to have exerted adequate effort during testing. Mr. Carn expressed his intention to perform to the best of his abilities and exhibited adequate task engagement and persistence.  Scores across stand-alone and embedded performance validity measures were within expectation. As such, the results of the  current evaluation are believed to be a valid representation of Mr. Mehaffey current cognitive functioning.  Test Results: Mr. Forst was oriented at the time of the current evaluation. He incorrectly stated the current year (2025) and was one day off when stating the current date.  Intellectual abilities based upon educational and vocational attainment were estimated to be in the average to above average range. Premorbid abilities were estimated to be within the above average range based upon a single-word reading test.   Processing speed was average to above average outside of an isolated impairment across a rapid color naming test. Basic attention was average. More complex attention (e.g., working memory) was also average. Executive functioning was exceptionally low to well below average. He did perform in the average range across a task assessing safety and judgment.  Assessed receptive language abilities were average. Likewise, Mr. Chohan did not exhibit any difficulties comprehending task instructions and answered all questions asked of him appropriately. Assessed expressive language (e.g., verbal fluency and confrontation naming) was exceptionally low to well below average.     Assessed visuospatial/visuoconstructional abilities were average to exceptionally high.    Learning (i.e., encoding) of novel information was average across a shape learning task but exceptionally low to below average across all verbal memory tasks. Spontaneous delayed recall (i.e., retrieval) of previously learned information was average across a shape learning task but exceptionally low to well below average across all verbal tasks. Retention rates were 0% across a list learning task, 71% across a story learning task, 13% across a daily living task, and 100% across a shape learning task.  Performance across recognition tasks was average across a shape learning task but exceptionally low across several verbal tasks, suggesting some limited evidence for information consolidation.   Results of emotional screening instruments suggested that recent symptoms of generalized anxiety were in the minimal range, while symptoms of depression were within normal limits. A screening instrument assessing recent sleep quality suggested the presence of minimal sleep dysfunction.  Table of Scores:   Note: This summary of test scores accompanies the interpretive report and should not be considered in isolation without reference to the appropriate sections in the text. Descriptors are based on appropriate normative data and may be adjusted based on clinical judgment. Terms such as Within Normal Limits and Outside Normal Limits are used when a more specific description of the test score cannot be determined. Descriptors refer to the current evaluation only.         Percentile - Normative Descriptor > 98 - Exceptionally High 91-97 - Well Above Average 75-90 - Above Average 25-74 - Average 9-24 - Below Average 2-8 - Well Below Average < 2 - Exceptionally Low         Validity: November 2023 November 2024 Current  DESCRIPTOR         DCT: --- --- --- --- Within Normal Limits  NAB EVI: --- --- --- --- Within Normal Limits         Orientation:        Raw Score Raw Score Raw Score Percentile   NAB Orientation, Form 1 28/29 27/29 27/29  --- ---         Cognitive Screening:        Raw Score Raw Score Raw Score Percentile   SLUMS: 21/30 18/30 16/30  --- ---         Intellectual Functioning:        Standard Score Standard Score Standard Score Percentile   Test of Premorbid  Functioning: 121 119 119 90 Above Average         Memory:       NAB Memory Module, Form 1: Standard Score/ T Score Standard Score/ T Score Standard Score/ T Score Percentile   Total Memory Index 70 61 65 1 Exceptionally Low   List Learning         Total Trials 1-3 15/36 (35) 9/36 (19) 13/36 (33) 5 Well Below Average    List B 2/12 (34) 4/12 (48) 4/12 (49) 46 Average    Short Delay Free Recall 0/12 (19) 0/12 (19) 1/12 (22) <1 Exceptionally Low    Long Delay Free Recall 0/12 (23) 0/12 (23) 0/12 (24) <1 Exceptionally Low    Retention Percentage 0 (22) 0 (22) 0 (24) <1 Exceptionally Low    Recognition Discriminability 4 (42) -3 (21) -4 (22) <1 Exceptionally Low  Shape Learning         Total Trials 1-3 17/27 (55) 14/27 (46) 13/27 (45) 31 Average    Delayed Recall 5/9 (46) 4/9 (40) 5/9 (48) 42 Average    Retention Percentage 63 (39) 67 (40) 100 (49) 46 Average    Recognition Discriminability 7 (52) 4 (36) 8 (59) 82 Above Average  Story Learning         Immediate Recall 43/80 (34) 31/80 (26) 27/80 (27) 1 Exceptionally Low    Delayed Recall 18/40 (32) 9/40 (30) 10/40 (30) 2 Well Below Average    Retention Percentage 78 (44) 43 (30) 71 (45) 31 Average  Daily Living Memory         Immediate Recall 43/51 (52) 36/51 (39) 34/51 (38) 12 Below Average    Delayed Recall 4/17 (19) 1/17 (19) 2/17 (19) <1 Exceptionally Low    Retention Percentage 24 (<19) 6 (<19) 13 (<19) <1 Exceptionally Low    Recognition Hits 7/10 (36) 6/10 (28) 5/10 (27) 1 Exceptionally Low         Attention/Executive Function:       Trail Making Test (TMT): Raw Score (T Score) Raw Score (T Score) Raw Score (T Score) Percentile     Part A 52 secs.,  1 error (37) 44 secs.,  1 error (41) 43 secs.,  0 errors (43) 25 Average    Part B 132 secs.,  0 errors (35) 158 secs.,  4 errors (31) 189 secs.,  1 error (33) 5 Well Below Average           Scaled Score Scaled Score Scaled Score Percentile   WAIS-IV Coding: 9 6 9  37 Average         NAB Attention Module, Form 1: T Score T Score T Score Percentile     Digits Forward 49 69 50 50 Average    Digits Backwards 58 50 45 31 Average          Scaled Score Scaled Score Scaled Score Percentile   WAIS-IV  Similarities: 5 6 9  37 Average         D-KEFS Color-Word Interference Test: Raw Score (Scaled Score) Raw Score (Scaled Score) Raw Score (Scaled Score) Percentile     Color Naming 46 secs. (5) 57 secs. (1) 50 secs. (3) 1 Exceptionally Low    Word Reading 21 secs. (12) 24 secs. (11) 22 secs. (12) 75 Above Average    Inhibition 93 secs. (7) 126 secs. (2) 180 secs. (1) <1 Exceptionally Low      Total Errors 1 error (12) 0 errors (13) 6 errors (7) 16 Below Average  Inhibition/Switching 111 secs. (6) 111 secs. (6) Discontinued --- Impaired      Total Errors 0 errors (13) 2 errors (11) --- --- ---         D-KEFS Verbal Fluency Test: Raw Score (Scaled Score) Raw Score (Scaled Score) Raw Score (Scaled Score) Percentile     Letter Total Correct 23 (6) 12 (3) 19 (5) 5 Well Below Average    Category Total Correct 13 (2) 14 (2) 9 (1) <1 Exceptionally Low    Category Switching Total Correct 5 (2) 4 (1) 5 (2) <1 Exceptionally Low    Category Switching Accuracy 2 (1) 3 (2) 3 (2) <1 Exceptionally Low      Total Set Loss Errors 1 (11) 1 (11) 1 (11) 63 Average      Total Repetition Errors 1 (12) 2 (11) 0 (13) 84 Above Average         NAB Executive Functions Module, Form 1: T Score T Score T Score Percentile     Judgment 58 71 49 46 Average         Language:       Verbal Fluency Test: Raw Score (T Score) Raw Score (T Score) Raw Score (T Score) Percentile     Phonemic Fluency (FAS) 23 (32) 12 (<19) 19 (29) 2 Exceptionally Low    Animal Fluency 8 (19) 6 (<19) 3 (<19) <1 Exceptionally Low          NAB Language Module, Form 1: T Score T Score T Score Percentile     Auditory Comprehension 56 56 56 73 Average    Naming 29/31 (43) 30/31 (56) 27/31 (33) 5 Well Below Average         Visuospatial/Visuoconstruction:        Raw Score Raw Score Raw Score Percentile   Clock Drawing: 10/10 8/10 8/10 --- Within Normal Limits         NAB Spatial Module, Form 1: T Score T Score T Score Percentile     Figure  Drawing Copy 53 57 71 98 Exceptionally High          Scaled Score Scaled Score Scaled Score Percentile   WAIS-IV Block Design: 8 8 8 25  Average         Mood and Personality:        Raw Score Raw Score Raw Score Percentile   Geriatric Depression Scale: 0 1 0 --- Within Normal Limits  Geriatric Anxiety Scale: 6 2 1  --- Minimal    Somatic 3 2 1  --- Minimal    Cognitive 3 0 0 --- Minimal    Affective 0 0 0 --- Minimal         Additional Questionnaires:        Raw Score Raw Score Raw Score Percentile   PROMIS Sleep Disturbance Questionnaire: 13 12 9  --- None to Slight   Informed Consent and Coding/Compliance:   The current evaluation represents a clinical evaluation for the purposes previously outlined by the referral source and is in no way reflective of a forensic evaluation.   Mr. Musa was provided with a verbal description of the nature and purpose of the present neuropsychological evaluation. Also reviewed were the foreseeable risks and/or discomforts and benefits of the procedure, limits of confidentiality, and mandatory reporting requirements of this provider. The patient was given the opportunity to ask questions and receive answers about the evaluation. Oral consent to participate was provided by the patient.   This evaluation was conducted by Arthea C.  Richie, Ph.D., ABPP-CN, board certified clinical neuropsychologist. Mr. Reggio completed a clinical interview with Dr. Richie, billed as one unit [phone], and 188 minutes of cognitive testing and scoring, billed as one unit [phone] and five additional units 96139. Psychometrist Evalene Pizza, B.S. assisted Dr. Richie with test administration and scoring procedures. As a separate and discrete service, one unit [phone] and two units 96133 (180 minutes) were billed for Dr. Loralee time spent in interpretation and report writing.      "

## 2024-09-25 ENCOUNTER — Ambulatory Visit: Payer: Self-pay | Admitting: Psychology

## 2024-09-25 DIAGNOSIS — F0670 Mild neurocognitive disorder due to known physiological condition without behavioral disturbance: Secondary | ICD-10-CM

## 2024-09-25 NOTE — Progress Notes (Signed)
"  ° °  Neuropsychology Feedback Session [name] [facility] [place] Department of Neurology  Reason for Referral:   Richard Harmon is a 74 y.o. right-handed Caucasian male referred by Camie Sevin, PA-C, to characterize his current cognitive functioning and assist with diagnostic clarity and treatment planning in the context of subjective cognitive decline.   Feedback:   [name] completed a comprehensive neuropsychological evaluation on 09/17/2024. Please refer to that encounter for the full report and recommendations. Briefly, results suggested significant impairment across executive functioning, expressive language (both verbal fluency and confrontation naming), and all aspects of verbal learning and memory. An isolated impairment was exhibited across a rapid color naming task; however, all other processing speed tasks were appropriate. Performances were also appropriate relative to age-matched peers across attention/concentration, safety/judgment, receptive language, visuospatial abilities, and visual learning and memory. Across his initial evaluation in November 2023, verbal memory impairment was already quite prominent. In relation to that evaluation, current testing provides evidence for progressive decline over time. This decline is observed despite him moving to an older normative grouping (i.e., compared to individuals 45-57 years old rather than previously being compared to individuals 32-28 years old). Additional decline was exhibited across executive functioning, verbal fluency (both phonemic and semantic), and confrontation naming. Current performances compared to his November 2024 evaluation suggested minimal change (executive functioning exhibited the largest degree of decline) and an overall slowly progressive neurological process. Unfortunately, [name] self-report of improved cognitive functioning and intact memory capabilities is inaccurate based upon objective  testing. Concerns surrounding an underlying neurodegenerative illness remain.  [name] was unaccompanied during the current feedback session. Content of the current session focused on the results of his neuropsychological evaluation. [name] was given the opportunity to ask questions and his questions were answered. He was encouraged to reach out should additional questions arise. A copy of his report was provided at the conclusion of the visit.      One unit 96132 (31 minutes) was billed for Dr. Loralee time spent preparing for, conducting, and documenting the current feedback session with [name]. "

## 2025-02-06 ENCOUNTER — Ambulatory Visit: Payer: Self-pay | Admitting: Physician Assistant
